# Patient Record
Sex: Female | Born: 1960
Health system: Southern US, Community
[De-identification: ages and names within clinical notes are randomized; demographics above are authoritative.]

## PROBLEM LIST (undated history)

## (undated) DIAGNOSIS — T7840XA Allergy, unspecified, initial encounter: Secondary | ICD-10-CM

## (undated) DIAGNOSIS — R7989 Other specified abnormal findings of blood chemistry: Secondary | ICD-10-CM

## (undated) DIAGNOSIS — F329 Major depressive disorder, single episode, unspecified: Secondary | ICD-10-CM

## (undated) DIAGNOSIS — R519 Headache, unspecified: Secondary | ICD-10-CM

## (undated) DIAGNOSIS — N390 Urinary tract infection, site not specified: Secondary | ICD-10-CM

## (undated) DIAGNOSIS — R740 Nonspecific elevation of levels of transaminase and lactic acid dehydrogenase [LDH]: Secondary | ICD-10-CM

## (undated) DIAGNOSIS — G43909 Migraine, unspecified, not intractable, without status migrainosus: Secondary | ICD-10-CM

## (undated) DIAGNOSIS — R51 Headache: Secondary | ICD-10-CM

## (undated) DIAGNOSIS — E785 Hyperlipidemia, unspecified: Secondary | ICD-10-CM

## (undated) DIAGNOSIS — F419 Anxiety disorder, unspecified: Secondary | ICD-10-CM

## (undated) DIAGNOSIS — M797 Fibromyalgia: Secondary | ICD-10-CM

## (undated) DIAGNOSIS — R32 Unspecified urinary incontinence: Secondary | ICD-10-CM

## (undated) DIAGNOSIS — F32A Depression, unspecified: Secondary | ICD-10-CM

## (undated) HISTORY — DX: Fibromyalgia: M79.7

## (undated) HISTORY — DX: Other specified abnormal findings of blood chemistry: R79.89

## (undated) HISTORY — DX: Nonspecific elevation of levels of transaminase and lactic acid dehydrogenase (ldh): R74.0

## (undated) HISTORY — PX: TONSILLECTOMY: SUR1361

## (undated) HISTORY — DX: Allergy, unspecified, initial encounter: T78.40XA

## (undated) HISTORY — DX: Migraine, unspecified, not intractable, without status migrainosus: G43.909

## (undated) HISTORY — DX: Major depressive disorder, single episode, unspecified: F32.9

## (undated) HISTORY — DX: Headache, unspecified: R51.9

## (undated) HISTORY — DX: Depression, unspecified: F32.A

## (undated) HISTORY — DX: Unspecified urinary incontinence: R32

## (undated) HISTORY — DX: Urinary tract infection, site not specified: N39.0

## (undated) HISTORY — DX: Anxiety disorder, unspecified: F41.9

## (undated) HISTORY — DX: Headache: R51

## (undated) HISTORY — DX: Hyperlipidemia, unspecified: E78.5

## (undated) HISTORY — PX: WISDOM TOOTH EXTRACTION: SHX21

---

## 2004-11-13 HISTORY — PX: LIPOMA EXCISION: SHX5283

## 2014-09-22 ENCOUNTER — Encounter: Payer: Self-pay | Admitting: Family Medicine

## 2014-09-22 ENCOUNTER — Ambulatory Visit (INDEPENDENT_AMBULATORY_CARE_PROVIDER_SITE_OTHER): Payer: Managed Care, Other (non HMO) | Admitting: Family Medicine

## 2014-09-22 VITALS — BP 112/78 | HR 82 | Temp 98.5°F | Ht 63.25 in | Wt 135.5 lb

## 2014-09-22 DIAGNOSIS — F411 Generalized anxiety disorder: Secondary | ICD-10-CM

## 2014-09-22 DIAGNOSIS — R32 Unspecified urinary incontinence: Secondary | ICD-10-CM | POA: Insufficient documentation

## 2014-09-22 DIAGNOSIS — M797 Fibromyalgia: Secondary | ICD-10-CM

## 2014-09-22 DIAGNOSIS — F410 Panic disorder [episodic paroxysmal anxiety] without agoraphobia: Secondary | ICD-10-CM

## 2014-09-22 MED ORDER — LORAZEPAM 0.5 MG PO TABS: 0.5000 mg | ORAL_TABLET | Freq: Two times a day (BID) | ORAL | Status: DC | PRN

## 2014-09-22 NOTE — Progress Notes (Signed)
HPI:  Madison Hart is here to establish care. Recently moved here from [place]. Last PCP and physical:  Has the following chronic problems that require follow up and concerns today:  Stress Incontinence: -dx 5 years ago -some urge and some stress -reports did pelvic floor exercises -tried a medication - she does not tolerate medications well, saw a physical therapist -never saw a urologist  GAD: -for about 5 years and worsening gradually -paleo diet helps -generalized worry all the time, occ panic attack, went through counseling with son and this helped -she wants to take something as needed for panic Restlessness, on edge: sometimes Easily Fatigued:yes Difficulty concentrating: no Irritability: no Muscle tension - yes, but has fibromyalgia Sleep Disturbance: yes - but she thinks this is related to the fibromyalgia  Fibromyalgia: -has seen rheumatologists in the past many times -no medications have worked -she does not want to take a medications -she does see a Restaurant manager, fast food and has changed - paleo diet  ROS negative for unless reported above: fevers, unintentional weight loss, hearing or vision loss, chest pain, palpitations, struggling to breath, hemoptysis, melena, hematochezia, hematuria, falls, loc, si, thoughts of self harm  Past Medical History  Diagnosis Date  . Fibromyalgia   . Allergy   . Depression   . Frequent headaches   . Migraine   . Urine incontinence   . Urinary tract infection     Past Surgical History  Procedure Laterality Date  . Tonsillectomy    . Wisdom tooth extraction      Family History  Problem Relation Age of Onset  . Cancer Father     mesothelioma - worked in Automotive engineer  . Hyperlipidemia Father   . Hypertension Father   . Mental illness Son   . Celiac disease Sister     History   Social History  . Marital Status: Married    Spouse Name: N/A    Number of Children: N/A  . Years of Education: N/A   Social History  Main Topics  . Smoking status: Never Smoker   . Smokeless tobacco: None  . Alcohol Use: No  . Drug Use: No  . Sexual Activity: None   Other Topics Concern  . 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      Home Situation: lives husband and daughter      Spiritual Beliefs: Christian      Lifestyle: regular exercise, healthy paleo diet - gluten free          Current outpatient prescriptions: COD LIVER OIL PO, Take by mouth., Disp: , Rfl: ;  ibuprofen (ADVIL,MOTRIN) 200 MG tablet, Take 200 mg by mouth as needed., Disp: , Rfl: ;  loratadine (CLARITIN) 10 MG tablet, Take 10 mg by mouth daily., Disp: , Rfl: ;  NON FORMULARY, Endura pak guard, Disp: , Rfl: ;  Probiotic Product (PROBIOTIC PO), Take by mouth., Disp: , Rfl:  LORazepam (ATIVAN) 0.5 MG tablet, Take 1 tablet (0.5 mg total) by mouth 2 (two) times daily as needed for anxiety., Disp: 15 tablet, Rfl: 0;  Multiple Vitamins-Minerals (EMERGEN-C IMMUNE PO), Take by mouth., Disp: , Rfl:   EXAM:  Filed Vitals:   09/22/14 1619  BP: 112/78  Pulse: 82  Temp: 98.5 F (36.9 C)    Body mass index is 23.8 kg/(m^2).  GENERAL: vitals reviewed and listed above, alert, oriented, appears well hydrated and in no acute distress  HEENT: atraumatic, conjunttiva clear, no obvious abnormalities on inspection of external nose and ears  NECK: no obvious masses on inspection  LUNGS: clear to auscultation bilaterally, no wheezes, rales or rhonchi, good air movement  CV: HRRR, no peripheral edema  MS: moves all extremities without noticeable abnormality  PSYCH: pleasant and cooperative, no obvious depression or anxiety  ASSESSMENT AND PLAN:  Discussed the following assessment and plan:  Urinary incontinence, unspecified incontinence type -discussed tx options and it seems she has tried most -trial gradually increased scheduled voiding time  -consider urology  referral   GAD (generalized anxiety disorder) Panic disorder - Plan: LORazepam (ATIVAN) 0.5 MG tablet -she is very sensitive to medications -I advised CBT and SSRI - but she does not want to take a medication daily -she has opted for rare use of benzo for panic - I advised if escalating use would advise she see a psychiatrist as I do not routinely prescribe these for regular risks due to risks - discussed risks  Fibromyalgia  -We reviewed the PMH, PSH, FH, SH, Meds and Allergies. -We provided refills for any medications we will prescribe as needed. -We addressed current concerns per orders and patient instructions. -We have asked for records for pertinent exams, studies, vaccines and notes from previous providers. -We have advised patient to follow up per instructions below.   -Patient advised to return or notify a doctor immediately if symptoms worsen or persist or new concerns arise.  Patient Instructions  -As we discussed, we have prescribed a new medication for you at this appointment. We discussed the common and serious potential adverse effects of this medication and you can review these and more with the pharmacist when you pick up your medication.  Please follow the instructions for use carefully and notify us immediately if you have any problems taking this medication.  Try gradually increasing the voiding time by 15 minutes weekly and restarting the pelvic floor exercises.   FOR YOUR ANXIETY, STRESS or DEPRESSION:  []  Seek counseling - this is as effective as medications and will help to get at the root of the imbalance. Please use the number provided to set up an appointment.  []  Ensure AT LEAST 150 minutes of cardiovascular exercise per week - 30 minutes of sweaty exercise daily is best.  []  Set a schedule that includes adequate time for sleep, fun activities and exercise.  []  Medications are best used short term while finding a healthier more balanced life that promotes  good emotional and mental health. I do not prescribe sleep medications such as Ambien, etc. or controlled anxiety medications such as Xanax, Klonopin, etc. long term in adult patients and will have you see a psychiatrist if these types of medications are required on more then a temporary basis. The Ativan is for rare use with the panic attacks, but if needing this on a more frequent basis will advise you to see a psychiatrist.         Lucretia Kern.

## 2014-09-22 NOTE — Progress Notes (Signed)
Pre visit review using our clinic review tool, if applicable. No additional management support is needed unless otherwise documented below in the visit note. 

## 2014-09-22 NOTE — Progress Notes (Deleted)
HPI:  Madison Hart is here to establish care.  Last PCP and physical:  Has the following chronic problems that require follow up and concerns today:     ROS negative for unless reported above: fevers, unintentional weight loss, hearing or vision loss, chest pain, palpitations, struggling to breath, hemoptysis, melena, hematochezia, hematuria, falls, loc, si, thoughts of self harm  Past Medical History  Diagnosis Date  . Fibromyalgia   . Allergy   . Depression   . Frequent headaches   . Migraine   . Urine incontinence   . Urinary tract infection     Past Surgical History  Procedure Laterality Date  . Tonsillectomy    . Wisdom tooth extraction      Family History  Problem Relation Age of Onset  . Cancer Father     mesothelioma - worked in Automotive engineer  . Hyperlipidemia Father   . Hypertension Father   . Mental illness Son   . Celiac disease Sister     History   Social History  . Marital Status: Married    Spouse Name: N/A    Number of Children: N/A  . Years of Education: N/A   Social History Main Topics  . Smoking status: Never Smoker   . Smokeless tobacco: None  . Alcohol Use: No  . Drug Use: No  . Sexual Activity: None   Other Topics Concern  . 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      Home Situation: lives husband and daughter      Spiritual Beliefs: Christian      Lifestyle: regular exercise, healthy paleo diet - gluten free          Current outpatient prescriptions: COD LIVER OIL PO, Take by mouth., Disp: , Rfl: ;  ibuprofen (ADVIL,MOTRIN) 200 MG tablet, Take 200 mg by mouth as needed., Disp: , Rfl: ;  loratadine (CLARITIN) 10 MG tablet, Take 10 mg by mouth daily., Disp: , Rfl: ;  NON FORMULARY, Endura pak guard, Disp: , Rfl: ;  Probiotic Product (PROBIOTIC PO), Take by mouth., Disp: , Rfl:  LORazepam (ATIVAN) 0.5 MG tablet, Take 1 tablet (0.5 mg total) by  mouth 2 (two) times daily as needed for anxiety., Disp: 15 tablet, Rfl: 0;  Multiple Vitamins-Minerals (EMERGEN-C IMMUNE PO), Take by mouth., Disp: , Rfl:   EXAM:  Filed Vitals:   09/22/14 1619  BP: 112/78  Pulse: 82  Temp: 98.5 F (36.9 C)    Body mass index is 23.8 kg/(m^2).  GENERAL: vitals reviewed and listed above, alert, oriented, appears well hydrated and in no acute distress  HEENT: atraumatic, conjunttiva clear, no obvious abnormalities on inspection of external nose and ears  NECK: no obvious masses on inspection  LUNGS: clear to auscultation bilaterally, no wheezes, rales or rhonchi, good air movement  CV: HRRR, no peripheral edema  MS: moves all extremities without noticeable abnormality  PSYCH: pleasant and cooperative, no obvious depression or anxiety  ASSESSMENT AND PLAN:  Discussed the following assessment and plan:  Urinary incontinence, unspecified incontinence type  GAD (generalized anxiety disorder)  Panic disorder - Plan: LORazepam (ATIVAN) 0.5 MG tablet  Fibromyalgia -We reviewed the PMH, PSH, FH, SH, Meds and Allergies. -We provided refills for any medications we will prescribe as needed. -We addressed current concerns per orders and patient instructions. -We have asked for records for pertinent exams, studies, vaccines and notes from previous  providers. -We have advised patient to follow up per instructions below.   -Patient advised to return or notify a doctor immediately if symptoms worsen or persist or new concerns arise.  Patient Instructions  -As we discussed, we have prescribed a new medication for you at this appointment. We discussed the common and serious potential adverse effects of this medication and you can review these and more with the pharmacist when you pick up your medication.  Please follow the instructions for use carefully and notify us immediately if you have any problems taking this medication.  Try gradually increasing  the voiding time by 15 minutes weekly and restarting the pelvic floor exercises.   FOR YOUR ANXIETY, STRESS or DEPRESSION:  []  Seek counseling - this is as effective as medications and will help to get at the root of the imbalance. Please use the number provided to set up an appointment.  []  Ensure AT LEAST 150 minutes of cardiovascular exercise per week - 30 minutes of sweaty exercise daily is best.  []  Set a schedule that includes adequate time for sleep, fun activities and exercise.  []  Medications are best used short term while finding a healthier more balanced life that promotes good emotional and mental health. I do not prescribe sleep medications such as Ambien, etc. or controlled anxiety medications such as Xanax, Klonopin, etc. long term in adult patients and will have you see a psychiatrist if these types of medications are required on more then a temporary basis. The Ativan is for rare use with the panic attacks, but if needing this on a more frequent basis will advise you to see a psychiatrist.         Lucretia Kern.

## 2014-09-22 NOTE — Patient Instructions (Signed)
-  As we discussed, we have prescribed a new medication for you at this appointment. We discussed the common and serious potential adverse effects of this medication and you can review these and more with the pharmacist when you pick up your medication.  Please follow the instructions for use carefully and notify us immediately if you have any problems taking this medication.  Try gradually increasing the voiding time by 15 minutes weekly and restarting the pelvic floor exercises.   FOR YOUR ANXIETY, STRESS or DEPRESSION:  []  Seek counseling - this is as effective as medications and will help to get at the root of the imbalance. Please use the number provided to set up an appointment.  []  Ensure AT LEAST 150 minutes of cardiovascular exercise per week - 30 minutes of sweaty exercise daily is best.  []  Set a schedule that includes adequate time for sleep, fun activities and exercise.  []  Medications are best used short term while finding a healthier more balanced life that promotes good emotional and mental health. I do not prescribe sleep medications such as Ambien, etc. or controlled anxiety medications such as Xanax, Klonopin, etc. long term in adult patients and will have you see a psychiatrist if these types of medications are required on more then a temporary basis. The Ativan is for rare use with the panic attacks, but if needing this on a more frequent basis will advise you to see a psychiatrist.

## 2014-10-13 ENCOUNTER — Ambulatory Visit: Payer: Self-pay | Admitting: Family Medicine

## 2014-12-23 ENCOUNTER — Encounter: Payer: Self-pay | Admitting: Family Medicine

## 2014-12-23 ENCOUNTER — Ambulatory Visit (INDEPENDENT_AMBULATORY_CARE_PROVIDER_SITE_OTHER): Payer: Managed Care, Other (non HMO) | Admitting: Family Medicine

## 2014-12-23 VITALS — BP 126/84 | HR 82 | Temp 97.9°F | Ht 63.75 in | Wt 136.7 lb

## 2014-12-23 DIAGNOSIS — R5383 Other fatigue: Secondary | ICD-10-CM

## 2014-12-23 DIAGNOSIS — M797 Fibromyalgia: Secondary | ICD-10-CM

## 2014-12-23 DIAGNOSIS — E785 Hyperlipidemia, unspecified: Secondary | ICD-10-CM

## 2014-12-23 DIAGNOSIS — F411 Generalized anxiety disorder: Secondary | ICD-10-CM

## 2014-12-23 DIAGNOSIS — F410 Panic disorder [episodic paroxysmal anxiety] without agoraphobia: Secondary | ICD-10-CM

## 2014-12-23 DIAGNOSIS — R32 Unspecified urinary incontinence: Secondary | ICD-10-CM

## 2014-12-23 LAB — LIPID PANEL
Cholesterol: 238 mg/dL — ABNORMAL HIGH (ref 0–200)
HDL: 47.6 mg/dL (ref >39.00)
LDL Cholesterol: 152 mg/dL — ABNORMAL HIGH (ref 0–99)
NONHDL: 190.4
Total CHOL/HDL Ratio: 5
Triglycerides: 190 mg/dL — ABNORMAL HIGH (ref 0.0–149.0)
VLDL: 38 mg/dL (ref 0.0–40.0)

## 2014-12-23 LAB — CBC WITH DIFFERENTIAL/PLATELET
Basophils Absolute: 0 10*3/uL (ref 0.0–0.1)
Basophils Relative: 0.8 % (ref 0.0–3.0)
EOS ABS: 0.1 10*3/uL (ref 0.0–0.7)
Eosinophils Relative: 2.4 % (ref 0.0–5.0)
HCT: 42.9 % (ref 36.0–46.0)
Hemoglobin: 14.6 g/dL (ref 12.0–15.0)
Lymphocytes Relative: 23.1 % (ref 12.0–46.0)
Lymphs Abs: 1.2 10*3/uL (ref 0.7–4.0)
MCHC: 34 g/dL (ref 30.0–36.0)
MCV: 91.2 fl (ref 78.0–100.0)
MONOS PCT: 10.1 % (ref 3.0–12.0)
Monocytes Absolute: 0.5 10*3/uL (ref 0.1–1.0)
NEUTROS ABS: 3.4 10*3/uL (ref 1.4–7.7)
Neutrophils Relative %: 63.6 % (ref 43.0–77.0)
Platelets: 245 10*3/uL (ref 150.0–400.0)
RBC: 4.71 Mil/uL (ref 3.87–5.11)
RDW: 13.5 % (ref 11.5–15.5)
WBC: 5.4 10*3/uL (ref 4.0–10.5)

## 2014-12-23 LAB — BASIC METABOLIC PANEL
BUN: 23 mg/dL (ref 6–23)
CALCIUM: 9.4 mg/dL (ref 8.4–10.5)
CHLORIDE: 105 meq/L (ref 96–112)
CO2: 29 mEq/L (ref 19–32)
Creatinine, Ser: 0.71 mg/dL (ref 0.40–1.20)
GFR: 90.65 mL/min (ref >60.00)
Glucose, Bld: 105 mg/dL — ABNORMAL HIGH (ref 70–99)
Potassium: 4.1 mEq/L (ref 3.5–5.1)
Sodium: 140 mEq/L (ref 135–145)

## 2014-12-23 LAB — VITAMIN D 25 HYDROXY (VIT D DEFICIENCY, FRACTURES): VITD: 26.63 ng/mL — AB (ref 30.00–100.00)

## 2014-12-23 LAB — TSH: TSH: 3.06 u[IU]/mL (ref 0.35–4.50)

## 2014-12-23 NOTE — Patient Instructions (Addendum)
BEFORE YOU LEAVE: -FASTING labs -schedule physical in 3 months  We recommend the following healthy lifestyle measures: - eat a healthy diet consisting of lots of vegetables, fruits, beans, nuts, seeds, healthy meats such as white chicken and fish and whole grains.  - avoid fried foods, fast food, processed foods, sodas, red meet and other fattening foods.  - get a least 150 minutes of aerobic exercise per week.   Consider cognitive behavioral therapy

## 2014-12-23 NOTE — Progress Notes (Signed)
Pre visit review using our clinic review tool, if applicable. No additional management support is needed unless otherwise documented below in the visit note. 

## 2014-12-23 NOTE — Progress Notes (Signed)
HPI:  Stress Incontinence: -dx 5 years ago -some urge and some stress -reports did pelvic floor exercises -tried medication - she does not tolerate medications well, saw a physical therapist -never saw a urologist -advised gradually increased timed voiding last visit  GAD: -for about 5 years and worsening gradually -paleo diet helps -generalized worry all the time, occ panic attack, went through counseling with son and this helped Restlessness, on edge: sometimes Easily Fatigued:yes Difficulty concentrating: no Irritability: no Muscle tension - yes, but has fibromyalgia Sleep Disturbance: yes - but she thinks this is related to the fibromyalgia -refused CBT or daily medication, uses ativan for panic attacks  Fibromyalgia: -has seen rheumatologists in the past many times -no medications have worked -she does not want to take a medications -she does see a Restaurant manager, fast food and has changed - paleo diet -sensitive to cold and some fatigue for her whole life -mild hyperlipidemia in the past -she wants to check TSH and vit D - reports can't take oral vit D -migraines forever  ROS: See pertinent positives and negatives per HPI.  Past Medical History  Diagnosis Date  . Fibromyalgia   . Allergy   . Depression   . Frequent headaches   . Migraine   . Urine incontinence   . Urinary tract infection     Past Surgical History  Procedure Laterality Date  . Tonsillectomy    . Wisdom tooth extraction      Family History  Problem Relation Age of Onset  . Cancer Father     mesothelioma - worked in Automotive engineer  . Hyperlipidemia Father   . Hypertension Father   . Mental illness Son   . Celiac disease Sister     History   Social History  . Marital Status: Married    Spouse Name: N/A  . Number of Children: N/A  . Years of Education: N/A   Social History Main Topics  . Smoking status: Never Smoker   . Smokeless tobacco: Not on file  . Alcohol Use: No  . Drug Use: No  .  Sexual Activity: Not on file   Other Topics Concern  . 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  COD LIVER OIL PO, Take by mouth., Disp: , Rfl:  .  ibuprofen (ADVIL,MOTRIN) 200 MG tablet, Take 200 mg by mouth as needed., Disp: , Rfl:  .  LORazepam (ATIVAN) 0.5 MG tablet, Take 1 tablet (0.5 mg total) by mouth 2 (two) times daily as needed for anxiety., Disp: 15 tablet, Rfl: 0 .  Multiple Vitamins-Minerals (EMERGEN-C IMMUNE PO), Take by mouth., Disp: , Rfl:  .  NON FORMULARY, Endura pak guard, Disp: , Rfl:  .  Probiotic Product (PROBIOTIC PO), Take by mouth., Disp: , Rfl:   EXAM:  Filed Vitals:   12/23/14 0851  BP: 126/84  Pulse: 82  Temp: 97.9 F (36.6 C)    Body mass index is 23.66 kg/(m^2).  GENERAL: vitals reviewed and listed above, alert, oriented, appears well hydrated and in no acute distress  HEENT: atraumatic, conjunttiva clear, no obvious abnormalities on inspection of external nose and ears  NECK: no obvious  masses on inspection  LUNGS: clear to auscultation bilaterally, no wheezes, rales or rhonchi, good air movement  CV: HRRR, no peripheral edema  MS: moves all extremities without noticeable abnormality  PSYCH: pleasant and cooperative, no obvious depression or anxiety  ASSESSMENT AND PLAN:  Discussed the following assessment and plan:  GAD (generalized anxiety disorder)  Panic disorder  Fibromyalgia - Plan: TSH  Urinary incontinence, unspecified incontinence type  Other fatigue - Plan: TSH, Lipid Panel, CBC with Differential, Basic metabolic panel, Vitamin D, 25-hydroxy  Hyperlipemia - Plan: Lipid Panel  -Patient advised to return or notify a doctor immediately if  symptoms worsen or persist or new concerns arise.  Patient Instructions  BEFORE YOU LEAVE: -FASTING labs -schedule physical in 3 months  We recommend the following healthy lifestyle measures: - eat a healthy diet consisting of lots of vegetables, fruits, beans, nuts, seeds, healthy meats such as white chicken and fish and whole grains.  - avoid fried foods, fast food, processed foods, sodas, red meet and other fattening foods.  - get a least 150 minutes of aerobic exercise per week.        Colin Benton R.

## 2014-12-25 ENCOUNTER — Encounter: Payer: Self-pay | Admitting: *Deleted

## 2015-01-13 ENCOUNTER — Other Ambulatory Visit (INDEPENDENT_AMBULATORY_CARE_PROVIDER_SITE_OTHER): Payer: Managed Care, Other (non HMO)

## 2015-01-13 DIAGNOSIS — R195 Other fecal abnormalities: Secondary | ICD-10-CM

## 2015-01-13 LAB — POC HEMOCCULT BLD/STL (HOME/3-CARD/SCREEN)
Card #3 Fecal Occult Blood, POC: NEGATIVE
FECAL OCCULT BLD: NEGATIVE
FECAL OCCULT BLD: NEGATIVE

## 2015-02-10 ENCOUNTER — Telehealth: Payer: Self-pay | Admitting: Family Medicine

## 2015-02-10 NOTE — Telephone Encounter (Signed)
Pt would like result of stool cultures cards she mailed in feb 2016

## 2015-02-11 NOTE — Telephone Encounter (Signed)
Left message on machine for patient to return our call 

## 2015-02-11 NOTE — Telephone Encounter (Signed)
Please provide her results. Thanks.

## 2015-02-12 NOTE — Telephone Encounter (Signed)
Patient informed stool cards were negative.

## 2015-03-23 ENCOUNTER — Encounter: Payer: Self-pay | Admitting: Family Medicine

## 2015-03-23 ENCOUNTER — Ambulatory Visit (INDEPENDENT_AMBULATORY_CARE_PROVIDER_SITE_OTHER): Payer: Managed Care, Other (non HMO) | Admitting: Family Medicine

## 2015-03-23 VITALS — BP 110/82 | HR 71 | Temp 98.1°F | Ht 63.5 in | Wt 130.6 lb

## 2015-03-23 DIAGNOSIS — Z0000 Encounter for general adult medical examination without abnormal findings: Secondary | ICD-10-CM

## 2015-03-23 DIAGNOSIS — Z780 Asymptomatic menopausal state: Secondary | ICD-10-CM | POA: Diagnosis not present

## 2015-03-23 DIAGNOSIS — R739 Hyperglycemia, unspecified: Secondary | ICD-10-CM | POA: Diagnosis not present

## 2015-03-23 DIAGNOSIS — E785 Hyperlipidemia, unspecified: Secondary | ICD-10-CM

## 2015-03-23 DIAGNOSIS — N950 Postmenopausal bleeding: Secondary | ICD-10-CM

## 2015-03-23 NOTE — Progress Notes (Signed)
HPI:  Here for CPE:  -Concerns and/or follow up today:  Stress Incontinence: -dx 5 years ago -some urge and some stress -reports did pelvic floor exercises; time voiding last visit -tried medication - she does not tolerate medications well, saw a physical therapist -never saw a urologist  GAD: -for about 5 years and worsening gradually -paleo diet and counseling with son helps -generalized worry all the time, occ panic attack -refused CBT now or daily medication, uses ativan for panic attacks  Fibromyalgia: -has seen rheumatologists in the past many times -no medications have worked -she does not want to take a medications -she does see a Restaurant manager, fast food and does paleo diet -sensitive to cold and some fatigue for her whole life -mild hyperlipidemia in the past -migraines forever  HLD/mild hyperglycemia: -Diet: paleo - but has changed diet a little bit -Exercise:  regular exercise - she had been lax on this but has restarted  -Taking folic acid, vitamin D or calcium: no - reports can't take oral vit D - reports flare of fibromyalgia with Vit D  -Diabetes and Dyslipidemia Screening: done 12/2014 - advised healthy diet and regular exercise  -Hx of HTN: no  -Vaccines: tdap  -pap history: thinks was 3 yrs ago, normal in the past  -FDLMP: n/a postmenopausal - but reports did have one episode of postmenopausal bleeding 1 month with cramping and bloating had not had period prior to this for a very long time, was similar to the last days of period per her report, light, brown, no vaginal discharge or symptoms otherwise  -sexual activity: yes, female partner, no new partners  -wants STI testing: no  -FH breast, colon or ovarian ca: see FH Last mammogram: not done Last colon cancer screening: did stool cards this year, refused colonoscopy  Breast Ca Risk Assessment: -no family hx breast or ovarian ca  -Alcohol, Tobacco, drug use: see social history  Review of Systems - no  fevers, unintentional weight loss, vision loss, hearing loss, chest pain, sob, hemoptysis, melena, hematochezia, hematuria, genital discharge, changing or concerning skin lesions, bleeding, bruising, loc, thoughts of self harm or SI  Past Medical History  Diagnosis Date  . Fibromyalgia   . Allergy   . Depression   . Frequent headaches   . Migraine   . Urine incontinence   . Urinary tract infection     Past Surgical History  Procedure Laterality Date  . Tonsillectomy    . Wisdom tooth extraction      Family History  Problem Relation Age of Onset  . Cancer Father     mesothelioma - worked in Automotive engineer  . Hyperlipidemia Father   . Hypertension Father   . Mental illness Son   . Celiac disease Sister     History   Social History  . Marital Status: Married    Spouse Name: N/A  . Number of Children: N/A  . Years of Education: N/A   Social History Main Topics  . Smoking status: Never Smoker   . Smokeless tobacco: Not on file  . Alcohol Use: No  . Drug Use: No  . Sexual Activity: Not on file   Other Topics Concern  . 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  COD LIVER OIL PO, Take by mouth., Disp: , Rfl:  .  ibuprofen (ADVIL,MOTRIN) 200 MG tablet, Take 200 mg by mouth as needed., Disp: , Rfl:  .  LORazepam (ATIVAN) 0.5 MG tablet, Take 1 tablet (0.5 mg total) by mouth 2 (two) times daily as needed for anxiety., Disp: 15 tablet, Rfl: 0 .  Multiple Vitamins-Minerals (EMERGEN-C IMMUNE PO), Take by mouth., Disp: , Rfl:  .  NON FORMULARY, Endura pak guard, Disp: , Rfl:  .  Probiotic Product (PROBIOTIC PO), Take by mouth., Disp: , Rfl:   EXAM:  Filed Vitals:   03/23/15 0828  BP: 110/82   Pulse: 71  Temp: 98.1 F (36.7 C)    GENERAL: vitals reviewed and listed below, alert, oriented, appears well hydrated and in no acute distress  HEENT: head atraumatic, PERRLA, normal appearance of eyes, ears, nose and mouth. moist mucus membranes.  NECK: supple, no masses or lymphadenopathy  LUNGS: clear to auscultation bilaterally, no rales, rhonchi or wheeze  CV: HRRR, no peripheral edema or cyanosis, normal pedal pulses  BREAST: declined  ABDOMEN: bowel sounds normal, soft, non tender to palpation, no masses, no rebound or guarding  GU: declined  RECTAL: refused  SKIN: no rash or abnormal lesions  MS: normal gait, moves all extremities normally  NEURO: CN II-XII grossly intact, normal muscle strength and sensation to light touch on extremities  PSYCH: normal affect, pleasant and cooperative  ASSESSMENT AND PLAN:  Discussed the following assessment and plan:  1. Visit for preventive health examination Visit for preventive health examination -Discussed and advised all [place] preventive services health task force level A and B recommendations for age, sex and risks. -Advised at least 150 minutes of exercise per week and a healthy diet low in saturated fats and sweets and consisting of fresh fruits and vegetables, lean meats such as fish and white chicken and whole grains. - she will schedule lab appointment as wants to work on diet and exercise a little longer before lab recheck, studies and vaccines per orders this encounter -she opted to do pap and breast exam with gyn  2. Postmenopausal bleeding - Ambulatory referral to Gynecology -likely benign given only occurred once, but she opted for gyn eval after discussion of potential etiologist  3. Hyperglycemia - Hemoglobin A1c  4. Hyperlipemia - Lipid Panel     Orders Placed This Encounter  Procedures  . Hemoglobin A1c  . Lipid Panel  . Ambulatory referral to Gynecology    Referral Priority:  Routine     Referral Type:  Consultation    Referral Reason:  Specialty Services Required    Requested Specialty:  Gynecology    Number of Visits Requested:  1    Patient advised to return to clinic immediately if symptoms worsen or persist or new concerns.  There are no Patient Instructions on file for this visit.  No Follow-up on file.  Colin Benton R.

## 2015-03-23 NOTE — Progress Notes (Signed)
Pre visit review using our clinic review tool, if applicable. No additional management support is needed unless otherwise documented below in the visit note. 

## 2015-03-23 NOTE — Patient Instructions (Signed)
BEFORE YOU LEAVE: -schedule lab only appointment for fasting labs in 1-2 months  -We placed a referral for you as discussed to the gynecologist for the spotting, a pap smear and your breast exam. It usually takes about 1-2 weeks to process and schedule this referral. If you have not heard from [place] regarding this appointment in 2 weeks please contact our office.  -schedule your mammogram  -follow up in 3-4 months

## 2015-03-26 ENCOUNTER — Other Ambulatory Visit: Payer: Self-pay

## 2015-03-26 DIAGNOSIS — Z1231 Encounter for screening mammogram for malignant neoplasm of breast: Secondary | ICD-10-CM

## 2015-03-29 ENCOUNTER — Encounter: Payer: Self-pay | Admitting: Obstetrics and Gynecology

## 2015-03-29 ENCOUNTER — Ambulatory Visit (INDEPENDENT_AMBULATORY_CARE_PROVIDER_SITE_OTHER): Payer: Managed Care, Other (non HMO) | Admitting: Obstetrics and Gynecology

## 2015-03-29 VITALS — BP 118/78 | HR 64 | Resp 14 | Ht 64.0 in | Wt 128.2 lb

## 2015-03-29 DIAGNOSIS — N950 Postmenopausal bleeding: Secondary | ICD-10-CM | POA: Diagnosis not present

## 2015-03-29 DIAGNOSIS — Z01419 Encounter for gynecological examination (general) (routine) without abnormal findings: Secondary | ICD-10-CM | POA: Diagnosis not present

## 2015-03-29 NOTE — Patient Instructions (Signed)
EXERCISE AND DIET:  We recommended that you start or continue a regular exercise program for good health. Regular exercise means any activity that makes your heart beat faster and makes you sweat.  We recommend exercising at least 30 minutes per day at least 3 days a week, preferably 4 or 5.  We also recommend a diet low in fat and sugar.  Inactivity, poor dietary choices and obesity can cause diabetes, heart attack, stroke, and kidney damage, among others.    ALCOHOL AND SMOKING:  Women should limit their alcohol intake to no more than 7 drinks/beers/glasses of wine (combined, not each!) per week. Moderation of alcohol intake to this level decreases your risk of breast cancer and liver damage. And of course, no recreational drugs are part of a healthy lifestyle.  And absolutely no smoking or even second hand smoke. Most people know smoking can cause heart and lung diseases, but did you know it also contributes to weakening of your bones? Aging of your skin?  Yellowing of your teeth and nails?  CALCIUM AND VITAMIN D:  Adequate intake of calcium and Vitamin D are recommended.  The recommendations for exact amounts of these supplements seem to change often, but generally speaking 600 mg of calcium (either carbonate or citrate) and 800 units of Vitamin D per day seems prudent. Certain women may benefit from higher intake of Vitamin D.  If you are among these women, your doctor will have told you during your visit.    PAP SMEARS:  Pap smears, to check for cervical cancer or precancers,  have traditionally been done yearly, although recent scientific advances have shown that most women can have pap smears less often.  However, every woman still should have a physical exam from her gynecologist every year. It will include a breast check, inspection of the vulva and vagina to check for abnormal growths or skin changes, a visual exam of the cervix, and then an exam to evaluate the size and shape of the uterus and  ovaries.  And after 56 years of age, a rectal exam is indicated to check for rectal cancers. We will also provide age appropriate advice regarding health maintenance, like when you should have certain vaccines, screening for sexually transmitted diseases, bone density testing, colonoscopy, mammograms, etc.   MAMMOGRAMS:  All women over 40 years old should have a yearly mammogram. Many facilities now offer a "3D" mammogram, which may cost around $50 extra out of pocket. If possible,  we recommend you accept the option to have the 3D mammogram performed.  It both reduces the number of women who will be called back for extra views which then turn out to be normal, and it is better than the routine mammogram at detecting truly abnormal areas.    COLONOSCOPY:  Colonoscopy to screen for colon cancer is recommended for all women at age 50.  We know, you hate the idea of the prep.  We agree, BUT, having colon cancer and not knowing it is worse!!  Colon cancer so often starts as a polyp that can be seen and removed at colonscopy, which can quite literally save your life!  And if your first colonoscopy is normal and you have no family history of colon cancer, most women don't have to have it again for 10 years.  Once every ten years, you can do something that may end up saving your life, right?  We will be happy to help you get it scheduled when you are ready.    Be sure to check your insurance coverage so you understand how much it will cost.  It may be covered as a preventative service at no cost, but you should check your particular policy.     Endometrial Biopsy Endometrial biopsy is a procedure in which a tissue sample is taken from inside the uterus. The tissue sample is then looked at under a microscope to see if the tissue is normal or abnormal. The endometrium is the lining of the uterus. This procedure helps determine where you are in your menstrual cycle and how hormone levels are affecting the lining of the  uterus. This procedure may also be used to evaluate uterine bleeding or to diagnose endometrial cancer, tuberculosis, polyps, or inflammatory conditions.  LET [facility] CARE PROVIDER KNOW ABOUT:  Any allergies you have.  All medicines you are taking, including vitamins, herbs, eye drops, creams, and over-the-counter medicines.  Previous problems you or members of your family have had with the use of anesthetics.  Any blood disorders you have.  Previous surgeries you have had.  Medical conditions you have.  Possibility of pregnancy. RISKS AND COMPLICATIONS Generally, this is a safe procedure. However, as with any procedure, complications can occur. Possible complications include:  Bleeding.  Pelvic infection.  Puncture of the uterine wall with the biopsy device (rare). BEFORE THE PROCEDURE   Keep a record of your menstrual cycles as directed by your health care provider. You may need to schedule your procedure for a specific time in your cycle.  You may want to bring a sanitary pad to wear home after the procedure.  Arrange for someone to drive you home after the procedure if you will be given a medicine to help you relax (sedative). PROCEDURE   You may be given a sedative to relax you.  You will lie on an exam table with your feet and legs supported as in a pelvic exam.  Your health care provider will insert an instrument (speculum) into your vagina to see your cervix.  Your cervix will be cleansed with an antiseptic solution. A medicine (local anesthetic) will be used to numb the cervix.  A forceps instrument (tenaculum) will be used to hold your cervix steady for the biopsy.  A thin, rodlike instrument (uterine sound) will be inserted through your cervix to determine the length of your uterus and the location where the biopsy sample will be removed.  A thin, flexible tube (catheter) will be inserted through your cervix and into the uterus. The catheter is used to  collect the biopsy sample from your endometrial tissue.  The catheter and speculum will then be removed, and the tissue sample will be sent to a lab for examination. AFTER THE PROCEDURE  You will rest in a recovery area until you are ready to go home.  You may have mild cramping and a small amount of vaginal bleeding for a few days after the procedure. This is normal.  Make sure you find out how to get your test results. Document Released: 03/02/2005 Document Revised: 07/02/2013 Document Reviewed: 04/16/2013 [employer] Patient Information 2015 [place], [place]. This information is not intended to replace advice given to you by your health care provider. Make sure you discuss any questions you have with your health care provider.

## 2015-03-29 NOTE — Progress Notes (Signed)
Patient ID: Madison Hart, female   DOB: 20-Feb-1961, 54 y.o.   MRN: [id number] GYNECOLOGY VISIT  PCP:  Colin Benton, DO  Referring provider:   HPI: 54 y.o.   Married  Caucasian  female   [phone] with Patient's last menstrual period was 11/13/2012 (approximate).   here for postmenopausal bleeding on one occasional for one day about six weeks ago.  Brown spotting.   In January felt like she was going to have her menses. Then had bleeding episode 5 - 6 weeks later.   No HRT.   Had an ultrasound with her second child showing fibroids.   History of heavy menses in past.   History of stress incontinence - leak with cough, sneeze, and laugh.  FH of stress incontinence.  Has a DVD to help with physical therapy.  From [place] near [place].  One child just graduated from high school.  GYNECOLOGIC HISTORY: Patient's last menstrual period was 11/13/2012 (approximate). Sexually active:  yes Partner preference: female Contraception: postmenopausal   Menopausal hormone therapy:none DES exposure: no Blood transfusions:  no  Sexually transmitted diseases: none   GYN procedures and prior surgeries: no  Last mammogram:  10 years ago--normal.  [facility]           Last pap and high risk HPV testing:   3 years ago--wnl History of abnormal pap smear:  no   OB History    Gravida Para Term Preterm AB TAB SAB Ectopic Multiple Living   3 3 3       3        Past Medical History  Diagnosis Date  . Fibromyalgia   . Allergy   . Depression   . Frequent headaches   . Migraine   . Urine incontinence   . Urinary tract infection   . Anxiety     Past Surgical History  Procedure Laterality Date  . Tonsillectomy    . Wisdom tooth extraction    . Lipoma excision  2006    left leg    Current Outpatient Prescriptions  Medication Sig Dispense Refill  . COD LIVER OIL PO Take by mouth.    Marland Kitchen ibuprofen (ADVIL,MOTRIN) 200 MG tablet Take 200 mg by mouth as needed.     Marland Kitchen LORazepam (ATIVAN) 0.5 MG tablet Take 1 tablet (0.5 mg total) by mouth 2 (two) times daily as needed for anxiety. 15 tablet 0  . Multiple Vitamins-Minerals (EMERGEN-C IMMUNE PO) Take by mouth.    . Probiotic Product (PROBIOTIC PO) Take by mouth.     No current facility-administered medications for this visit.     ALLERGIES: Claritin; Detrol; Erythromycin; and Penicillins  Family History  Problem Relation Age of Onset  . Cancer Father     mesothelioma - worked in Automotive engineer  . Hyperlipidemia Father   . Hypertension Father   . Mental illness Son   . Celiac disease Sister   . Migraines Sister   . Hyperlipidemia Mother     History   Social History  . Marital Status: Married    Spouse Name: N/A  . Number of Children: N/A  . Years of Education: N/A   Occupational History  . Not on file.   Social History Main Topics  . Smoking status: Never Smoker   . Smokeless tobacco: Not on file  . Alcohol Use: No  . Drug Use: No  . Sexual Activity:    Partners: Male    Birth Control/ Protection: Post-menopausal  Other Topics Concern  . 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  PHYSICAL EXAMINATION:    BP 118/78 mmHg  Pulse 64  Resp 14  Ht 5\' 4"  (1.626 m)  Wt 128 lb 3.2 oz (58.151 kg)  BMI 21.99 kg/m2  LMP 11/13/2012 (Approximate)   Wt Readings from Last 3 Encounters:  03/29/15 128 lb 3.2 oz (58.151 kg)  03/23/15 130 lb 9.6 oz (59.24 kg)  12/23/14 136 lb 11.2 oz (62.007 kg)     Ht Readings from Last 3 Encounters:  03/29/15 5\' 4"  (1.626 m)  03/23/15 5' 3.5" (1.613 m)  12/23/14 5' 3.75" (1.619 m)    General appearance: alert, cooperative and appears stated age Head:  Normocephalic, without obvious abnormality, atraumatic Neck: no adenopathy, supple, symmetrical, trachea midline and thyroid not enlarged, symmetric, no tenderness/mass/nodules Lungs: clear to auscultation bilaterally Breasts: Inspection negative, No nipple retraction or dimpling, No nipple discharge or bleeding, No axillary or supraclavicular adenopathy, Normal to palpation without dominant masses Heart: regular rate and rhythm Abdomen: soft, non-tender; no masses,  no organomegaly Extremities: extremities normal, atraumatic, no cyanosis or edema Skin: Skin color, texture, turgor normal. No rashes or lesions Lymph nodes: Cervical, supraclavicular, and axillary nodes normal. No abnormal inguinal nodes palpated Neurologic: Grossly normal  Pelvic: External genitalia:  no lesions              Urethra:  normal appearing urethra with no masses, tenderness or lesions              Bartholins and Skenes: normal                 Vagina: normal appearing vagina with normal color and discharge, no lesions              Cervix: normal appearance.  Pap smear taken - yes.                  Bimanual Exam:  Uterus:  uterus is normal size, shape, consistency and nontender                                      Adnexa: normal adnexa in size, nontender and no masses                                      Rectovaginal: Confirms                                      Anus:  normal sphincter tone, no lesions  ASSESSMENT  Encounter for gynecologic visit.  Postmenopausal bleeding episode.  Urinary incontinence.   PLAN  Pap and HR HPV testing done.  Recommendation for mammogram.  Will do at [facility] June.  Discussed etiologies of and work up of postmenopausal bleeding. Will return for pelvic ultrasound, possible sonohysterogram, and possible endometrial biopsy.  Procedures explained.  These will be precerted.  Discussed urinary incontinence.  Declines further care at this time.  Routine labs with PCP.   Vaccines with PCP.  Colonoscopy discussed.  Patient declines and will do IFOBs with PCP instead.   An After Visit Summary was printed and given to the patient.  __10____ minutes additional face to face time regarding postmenopausal bleeding - of which over 50% was spent in counseling.

## 2015-03-31 LAB — IPS PAP TEST WITH HPV

## 2015-04-02 ENCOUNTER — Telehealth: Payer: Self-pay | Admitting: *Deleted

## 2015-04-02 NOTE — Telephone Encounter (Signed)
Pt has a mammogram scheduled in June.

## 2015-04-02 NOTE — Telephone Encounter (Signed)
Left message for pt to call back.  Need to know if pt had mammogram, when and where

## 2015-04-16 ENCOUNTER — Telehealth: Payer: Self-pay | Admitting: Obstetrics and Gynecology

## 2015-04-16 DIAGNOSIS — N950 Postmenopausal bleeding: Secondary | ICD-10-CM

## 2015-04-16 NOTE — Telephone Encounter (Signed)
Call to patient. Left message with patient spouse for her to call back. Need to go over benefit for SHGM/EMB

## 2015-04-16 NOTE — Telephone Encounter (Signed)
Patient returned call. I advised of benefit quote received for SHGM/EMB.  Patient agreeable. Scheduled procedure. Advised patient of 72 hour cancellation policy and $322 cancellation fee. Patient agreeable.

## 2015-04-16 NOTE — Telephone Encounter (Signed)
Pt called concerning her 5/16 appointment in which additional testing was recommended. She stated she was expecting a call with insurance information of charges and scheduling. Pt is agreeable to a call back.

## 2015-04-16 NOTE — Telephone Encounter (Signed)
Patient was seen on 5/16 with Dr.Silva for aex. Patient recommended to have workup for PMB with PUS/possibleSHGM and possible EMB. Orders placed for precert.

## 2015-04-21 ENCOUNTER — Ambulatory Visit
Admission: RE | Admit: 2015-04-21 | Discharge: 2015-04-21 | Disposition: A | Discharge status: Home / Self Care | Payer: Managed Care, Other (non HMO) | Source: Ambulatory Visit

## 2015-04-21 DIAGNOSIS — Z1231 Encounter for screening mammogram for malignant neoplasm of breast: Secondary | ICD-10-CM

## 2015-05-06 ENCOUNTER — Encounter: Payer: Self-pay | Admitting: Obstetrics and Gynecology

## 2015-05-06 ENCOUNTER — Ambulatory Visit (INDEPENDENT_AMBULATORY_CARE_PROVIDER_SITE_OTHER): Payer: Managed Care, Other (non HMO)

## 2015-05-06 ENCOUNTER — Ambulatory Visit (INDEPENDENT_AMBULATORY_CARE_PROVIDER_SITE_OTHER): Payer: Managed Care, Other (non HMO) | Admitting: Obstetrics and Gynecology

## 2015-05-06 ENCOUNTER — Other Ambulatory Visit: Payer: Self-pay | Admitting: Obstetrics and Gynecology

## 2015-05-06 VITALS — BP 100/78 | HR 70 | Ht 64.0 in | Wt 127.0 lb

## 2015-05-06 DIAGNOSIS — N950 Postmenopausal bleeding: Secondary | ICD-10-CM | POA: Diagnosis not present

## 2015-05-06 DIAGNOSIS — N951 Menopausal and female climacteric states: Secondary | ICD-10-CM | POA: Diagnosis not present

## 2015-05-06 NOTE — Progress Notes (Signed)
Subjective  54 y.o. G37P3003  Caucasian female here for pelvic ultrasound for postmenopausal bleeding episode.  Had bleeding one time for one day.  LMP was 2014.  Not on HRT.  Felt she had a hormonal spike and then had the bleeding following this.  Felt so much better at that time and really feels it was due to an increase in her hormones.  Developed more depressive type symptoms and anxiety in her 37s and have continued.  Can function well.  Not suicidal.  Declines antidepressant therapy.  Will see her naturopath soon to discuss HRT.  Declines Tdap today.  Traveling for a wedding and does not want a sore arm.   Objective  Pelvic ultrasound images and report reviewed with patient.  Uterus - no masses. EMS - 2.81 mm. Ovaries - normal Free fluid - no     Assessment  Postmenopausal bleeding. Reassuring endometrium and pelvic ultrasound in general. Menopausal symptoms.  Depression/anxiety symptoms.   Plan  Discussion of postmenopausal bleeding.  I do not believe that a sonohysterogram or endometrial biopsy is needed.  Call for any future postmenopausal bleeding.  Understands the importance of this in detecting precancer and cancer of the uterus.  Discussion of HRT risks and benefits.  Reviewed Women's Health Initiative.  Discussion of SSRIs/SNRIs.  Declines.  Follow up for annual exam and prn.  Declines TDap.  ___25____ minutes face to face time of which over 50% was spent in counseling.   After visit summary to patient.

## 2015-07-26 ENCOUNTER — Ambulatory Visit: Payer: Managed Care, Other (non HMO) | Admitting: Family Medicine

## 2015-08-10 ENCOUNTER — Encounter: Payer: Self-pay | Admitting: Family Medicine

## 2015-08-10 ENCOUNTER — Ambulatory Visit (INDEPENDENT_AMBULATORY_CARE_PROVIDER_SITE_OTHER): Payer: Managed Care, Other (non HMO) | Admitting: Family Medicine

## 2015-08-10 VITALS — BP 110/78 | HR 89 | Temp 98.4°F | Ht 64.0 in | Wt 127.3 lb

## 2015-08-10 DIAGNOSIS — M79641 Pain in right hand: Secondary | ICD-10-CM

## 2015-08-10 DIAGNOSIS — R739 Hyperglycemia, unspecified: Secondary | ICD-10-CM | POA: Diagnosis not present

## 2015-08-10 DIAGNOSIS — E785 Hyperlipidemia, unspecified: Secondary | ICD-10-CM | POA: Diagnosis not present

## 2015-08-10 NOTE — Progress Notes (Signed)
Pre visit review using our clinic review tool, if applicable. No additional management support is needed unless otherwise documented below in the visit note. 

## 2015-08-10 NOTE — Progress Notes (Signed)
HPI:   HLD/mild hyperglycemia: -advised recheck and medication if not improving - not done as advised -she reports she exercised, then did not for 3 months, then did start again 1 month ago, eating healthy diet -she doe snot want to check labs again until exercising for three months and refused today  R hand pain: -for several months and lump R palmar suface near 3rd mcp joint -no clickin or catching, occ discomfort   ROS: See pertinent positives and negatives per HPI.  Past Medical History  Diagnosis Date  . Fibromyalgia   . Allergy   . Depression   . Frequent headaches   . Migraine   . Urine incontinence   . Urinary tract infection   . Anxiety     Past Surgical History  Procedure Laterality Date  . Tonsillectomy    . Wisdom tooth extraction    . Lipoma excision  2006    left leg    Family History  Problem Relation Age of Onset  . Cancer Father     mesothelioma - worked in Automotive engineer  . Hyperlipidemia Father   . Hypertension Father   . Mental illness Son   . Celiac disease Sister   . Migraines Sister   . Hyperlipidemia Mother     Social History   Social History  . Marital Status: Married    Spouse Name: N/A  . Number of Children: N/A  . Years of Education: N/A   Social History Main Topics  . Smoking status: Never Smoker   . Smokeless tobacco: None  . Alcohol Use: No  . Drug Use: No  . Sexual Activity:    Partners: Male    Birth Control/ Protection: Post-menopausal   Other Topics Concern  . 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  COD LIVER OIL PO, Take by mouth., Disp: , Rfl:  .  ibuprofen (ADVIL,MOTRIN) 200 MG tablet, Take 200 mg by mouth as needed., Disp: ,  Rfl:  .  LORazepam (ATIVAN) 0.5 MG tablet, Take 1 tablet (0.5 mg total) by mouth 2 (two) times daily as needed for anxiety., Disp: 15 tablet, Rfl: 0 .  Multiple Vitamins-Minerals (EMERGEN-C IMMUNE PO), Take by mouth., Disp: , Rfl:  .  Probiotic Product (PROBIOTIC PO), Take by mouth., Disp: , Rfl:   EXAM:  Filed Vitals:   08/10/15 1339  BP: 110/78  Pulse: 89  Temp: 98.4 F (36.9 C)    Body mass index is 21.84 kg/(m^2).  GENERAL: vitals reviewed and listed above, alert, oriented, appears well hydrated and in no acute distress  HEENT: atraumatic, conjunttiva clear, no obvious abnormalities on inspection of external nose and ears  NECK: no obvious masses on inspection  LUNGS: clear to auscultation bilaterally, no wheezes, rales or rhonchi, good air movement  CV: HRRR, no peripheral edema  MS: moves all extremities without noticeable abnormality, small mildly tender rubbery mobile subcutaneous nodule palmar surface of hand over flexor tendons of 3rd digit  PSYCH: pleasant and cooperative, no obvious depression or anxiety  ASSESSMENT AND PLAN:  Discussed the following assessment and plan:  Hyperlipemia  Hyperglycemia  Hand pain, right  -  advised labs, declined, she agreed to check in 2 months after 3 months of lifestyle changes -discussed potential etiologies of hand nodule with tenosynovitis most likely, offered further eval with US/xray or with sports med for confirmation of diagnosis - declined at this time -Patient advised to return or notify a doctor immediately if symptoms worsen or persist or new concerns arise.  Patient Instructions  BEFORE YOU LEAVE: -schedule fasting lab visit in 2 months  Continue a healthy diet and regular exercise  Let us know if you wish to have further evaluation of the hand     Lexani Corona R.

## 2015-08-10 NOTE — Patient Instructions (Signed)
BEFORE YOU LEAVE: -schedule fasting lab visit in 2 months  Continue a healthy diet and regular exercise  Let us know if you wish to have further evaluation of the hand

## 2015-10-14 ENCOUNTER — Other Ambulatory Visit (INDEPENDENT_AMBULATORY_CARE_PROVIDER_SITE_OTHER): Payer: Managed Care, Other (non HMO)

## 2015-10-14 DIAGNOSIS — E785 Hyperlipidemia, unspecified: Secondary | ICD-10-CM

## 2015-10-14 DIAGNOSIS — E119 Type 2 diabetes mellitus without complications: Secondary | ICD-10-CM

## 2015-10-14 LAB — LIPID PANEL
Cholesterol: 241 mg/dL — ABNORMAL HIGH (ref 0–200)
HDL: 48.5 mg/dL (ref >39.00)
LDL CALC: 175 mg/dL — AB (ref 0–99)
NONHDL: 192.53
Total CHOL/HDL Ratio: 5
Triglycerides: 90 mg/dL (ref 0.0–149.0)
VLDL: 18 mg/dL (ref 0.0–40.0)

## 2015-10-14 LAB — HEMOGLOBIN A1C: HEMOGLOBIN A1C: 5.4 % (ref 4.6–6.5)

## 2016-03-31 ENCOUNTER — Encounter: Payer: Self-pay | Admitting: Obstetrics and Gynecology

## 2016-03-31 ENCOUNTER — Ambulatory Visit (INDEPENDENT_AMBULATORY_CARE_PROVIDER_SITE_OTHER): Payer: BLUE CROSS/BLUE SHIELD | Admitting: Obstetrics and Gynecology

## 2016-03-31 VITALS — BP 112/70 | HR 80 | Resp 14 | Ht 63.0 in | Wt 135.6 lb

## 2016-03-31 DIAGNOSIS — Z01419 Encounter for gynecological examination (general) (routine) without abnormal findings: Secondary | ICD-10-CM

## 2016-03-31 DIAGNOSIS — N393 Stress incontinence (female) (male): Secondary | ICD-10-CM

## 2016-03-31 NOTE — Patient Instructions (Signed)
Health Maintenance, Female Adopting a healthy lifestyle and getting preventive care can go a long way to promote health and wellness. Talk with your health care provider about what schedule of regular examinations is right for you. This is a good chance for you to check in with your provider about disease prevention and staying healthy. In between checkups, there are plenty of things you can do on your own. Experts have done a lot of research about which lifestyle changes and preventive measures are most likely to keep you healthy. Ask your health care provider for more information. WEIGHT AND DIET  Eat a healthy diet  Be sure to include plenty of vegetables, fruits, low-fat dairy products, and lean protein.  Do not eat a lot of foods high in solid fats, added sugars, or salt.  Get regular exercise. This is one of the most important things you can do for your health.  Most adults should exercise for at least 150 minutes each week. The exercise should increase your heart rate and make you sweat (moderate-intensity exercise).  Most adults should also do strengthening exercises at least twice a week. This is in addition to the moderate-intensity exercise.  Maintain a healthy weight  Body mass index (BMI) is a measurement that can be used to identify possible weight problems. It estimates body fat based on height and weight. Your health care provider can help determine your BMI and help you achieve or maintain a healthy weight.  For females 42 years of age and older:   A BMI below 18.5 is considered underweight.  A BMI of 18.5 to 24.9 is normal.  A BMI of 25 to 29.9 is considered overweight.  A BMI of 30 and above is considered obese.  Watch levels of cholesterol and blood lipids  You should start having your blood tested for lipids and cholesterol at 57 years of age, then have this test every 5 years.  You may need to have your cholesterol levels checked more often if:  Your lipid  or cholesterol levels are high.  You are older than 57 years of age.  You are at high risk for heart disease.  CANCER SCREENING   Lung Cancer  Lung cancer screening is recommended for adults 66-82 years old who are at high risk for lung cancer because of a history of smoking.  A yearly low-dose CT scan of the lungs is recommended for people who:  Currently smoke.  Have quit within the past 15 years.  Have at least a 30-pack-year history of smoking. A pack year is smoking an average of one pack of cigarettes a day for 1 year.  Yearly screening should continue until it has been 15 years since you quit.  Yearly screening should stop if you develop a health problem that would prevent you from having lung cancer treatment.  Breast Cancer  Practice breast self-awareness. This means understanding how your breasts normally appear and feel.  It also means doing regular breast self-exams. Let your health care provider know about any changes, no matter how small.  If you are in your 20s or 30s, you should have a clinical breast exam (CBE) by a health care provider every 1-3 years as part of a regular health exam.  If you are 32 or older, have a CBE every year. Also consider having a breast X-ray (mammogram) every year.  If you have a family history of breast cancer, talk to your health care provider about genetic screening.  If you  are at high risk for breast cancer, talk to your health care provider about having an MRI and a mammogram every year.  Breast cancer gene (BRCA) assessment is recommended for women who have family members with BRCA-related cancers. BRCA-related cancers include:  Breast.  Ovarian.  Tubal.  Peritoneal cancers.  Results of the assessment will determine the need for genetic counseling and BRCA1 and BRCA2 testing. Cervical Cancer Your health care provider may recommend that you be screened regularly for cancer of the pelvic organs (ovaries, uterus, and  vagina). This screening involves a pelvic examination, including checking for microscopic changes to the surface of your cervix (Pap test). You may be encouraged to have this screening done every 3 years, beginning at age 21.  For women ages 30-65, health care providers may recommend pelvic exams and Pap testing every 3 years, or they may recommend the Pap and pelvic exam, combined with testing for human papilloma virus (HPV), every 5 years. Some types of HPV increase your risk of cervical cancer. Testing for HPV may also be done on women of any age with unclear Pap test results.  Other health care providers may not recommend any screening for nonpregnant women who are considered low risk for pelvic cancer and who do not have symptoms. Ask your health care provider if a screening pelvic exam is right for you.  If you have had past treatment for cervical cancer or a condition that could lead to cancer, you need Pap tests and screening for cancer for at least 20 years after your treatment. If Pap tests have been discontinued, your risk factors (such as having a new sexual partner) need to be reassessed to determine if screening should resume. Some women have medical problems that increase the chance of getting cervical cancer. In these cases, your health care provider may recommend more frequent screening and Pap tests. Colorectal Cancer  This type of cancer can be detected and often prevented.  Routine colorectal cancer screening usually begins at 57 years of age and continues through 57 years of age.  Your health care provider may recommend screening at an earlier age if you have risk factors for colon cancer.  Your health care provider may also recommend using home test kits to check for hidden blood in the stool.  A small camera at the end of a tube can be used to examine your colon directly (sigmoidoscopy or colonoscopy). This is done to check for the earliest forms of colorectal  cancer.  Routine screening usually begins at age 50.  Direct examination of the colon should be repeated every 5-10 years through 57 years of age. However, you may need to be screened more often if early forms of precancerous polyps or small growths are found. Skin Cancer  Check your skin from head to toe regularly.  Tell your health care provider about any new moles or changes in moles, especially if there is a change in a mole's shape or color.  Also tell your health care provider if you have a mole that is larger than the size of a pencil eraser.  Always use sunscreen. Apply sunscreen liberally and repeatedly throughout the day.  Protect yourself by wearing long sleeves, pants, a wide-brimmed hat, and sunglasses whenever you are outside. HEART DISEASE, DIABETES, AND HIGH BLOOD PRESSURE   High blood pressure causes heart disease and increases the risk of stroke. High blood pressure is more likely to develop in:  People who have blood pressure in the high end   of the normal range (130-139/85-89 mm Hg).  People who are overweight or obese.  People who are African American.  If you are 38-23 years of age, have your blood pressure checked every 3-5 years. If you are 61 years of age or older, have your blood pressure checked every year. You should have your blood pressure measured twice--once when you are at a hospital or clinic, and once when you are not at a hospital or clinic. Record the average of the two measurements. To check your blood pressure when you are not at a hospital or clinic, you can use:  An automated blood pressure machine at a pharmacy.  A home blood pressure monitor.  If you are between 45 years and 39 years old, ask your health care provider if you should take aspirin to prevent strokes.  Have regular diabetes screenings. This involves taking a blood sample to check your fasting blood sugar level.  If you are at a normal weight and have a low risk for diabetes,  have this test once every three years after 57 years of age.  If you are overweight and have a high risk for diabetes, consider being tested at a younger age or more often. PREVENTING INFECTION  Hepatitis B  If you have a higher risk for hepatitis B, you should be screened for this virus. You are considered at high risk for hepatitis B if:  You were born in a country where hepatitis B is common. Ask your health care provider which countries are considered high risk.  Your parents were born in a high-risk country, and you have not been immunized against hepatitis B (hepatitis B vaccine).  You have HIV or AIDS.  You use needles to inject street drugs.  You live with someone who has hepatitis B.  You have had sex with someone who has hepatitis B.  You get hemodialysis treatment.  You take certain medicines for conditions, including cancer, organ transplantation, and autoimmune conditions. Hepatitis C  Blood testing is recommended for:  Everyone born from [birth year] through 1967.  Anyone with known risk factors for hepatitis C. Sexually transmitted infections (STIs)  You should be screened for sexually transmitted infections (STIs) including gonorrhea and chlamydia if:  You are sexually active and are younger than 57 years of age.  You are older than 57 years of age and your health care provider tells you that you are at risk for this type of infection.  Your sexual activity has changed since you were last screened and you are at an increased risk for chlamydia or gonorrhea. Ask your health care provider if you are at risk.  If you do not have HIV, but are at risk, it may be recommended that you take a prescription medicine daily to prevent HIV infection. This is called pre-exposure prophylaxis (PrEP). You are considered at risk if:  You are sexually active and do not regularly use condoms or know the HIV status of your partner(s).  You take drugs by injection.  You are sexually  active with a partner who has HIV. Talk with your health care provider about whether you are at high risk of being infected with HIV. If you choose to begin PrEP, you should first be tested for HIV. You should then be tested every 3 months for as long as you are taking PrEP.  PREGNANCY   If you are premenopausal and you may become pregnant, ask your health care provider about preconception counseling.  If you may  become pregnant, take 400 to 800 micrograms (mcg) of folic acid every day.  If you want to prevent pregnancy, talk to your health care provider about birth control (contraception). OSTEOPOROSIS AND MENOPAUSE   Osteoporosis is a disease in which the bones lose minerals and strength with aging. This can result in serious bone fractures. Your risk for osteoporosis can be identified using a bone density scan.  If you are 65 years of age or older, or if you are at risk for osteoporosis and fractures, ask your health care provider if you should be screened.  Ask your health care provider whether you should take a calcium or vitamin D supplement to lower your risk for osteoporosis.  Menopause may have certain physical symptoms and risks.  Hormone replacement therapy may reduce some of these symptoms and risks. Talk to your health care provider about whether hormone replacement therapy is right for you.  HOME CARE INSTRUCTIONS   Schedule regular health, dental, and eye exams.  Stay current with your immunizations.   Do not use any tobacco products including cigarettes, chewing tobacco, or electronic cigarettes.  If you are pregnant, do not drink alcohol.  If you are breastfeeding, limit how much and how often you drink alcohol.  Limit alcohol intake to no more than 1 drink per day for nonpregnant women. One drink equals 12 ounces of beer, 5 ounces of wine, or 1 ounces of hard liquor.  Do not use street drugs.  Do not share needles.  Ask your health care provider for help if  you need support or information about quitting drugs.  Tell your health care provider if you often feel depressed.  Tell your health care provider if you have ever been abused or do not feel safe at home.   This information is not intended to replace advice given to you by your health care provider. Make sure you discuss any questions you have with your health care provider.   Document Released: 05/15/2011 Document Revised: 11/20/2014 Document Reviewed: 10/01/2013 Elsevier Interactive Patient Education 2016 [employer]  Menopause and Herbal Products WHAT IS MENOPAUSE? Menopause is the normal time of life when menstrual periods decrease in frequency and eventually stop completely. This process can take several years for some women. Menopause is complete when you have had an absence of menstruation for a full year since your last menstrual period. It usually occurs between the ages of 48 and 55. It is not common for menopause to begin before the age of 40. During menopause, your body stops producing the female hormones estrogen and progesterone. Common symptoms associated with this loss of hormones (vasomotor symptoms) are:  Hot flashes.  Hot flushes.  Night sweats. Other common symptoms and complications of menopause include:  Decrease in sex drive.  Vaginal dryness and thinning of the walls of the vagina. This can make sex painful.  Dryness of the skin and development of wrinkles.  Headaches.  Tiredness.  Irritability.  Memory problems.  Weight gain.  Bladder infections.  Hair growth on the face and chest.  Inability to reproduce offspring (infertility).  Loss of density in the bones (osteoporosis) increasing your risk for breaks (fractures).  Depression.  Hardening and narrowing of the arteries (atherosclerosis). This increases your risk of heart attack and stroke. WHAT TREATMENT OPTIONS ARE AVAILABLE? There are many treatment choices for menopause symptoms. The  most common treatment is hormone replacement therapy. Many alternative therapies for menopause are emerging, including the use of herbal products. These supplements can   be found in the form of herbs, teas, oils, tinctures, and pills. Common herbal supplements for menopause are made from plants that contain phytoestrogens. Phytoestrogens are compounds that occur naturally in plants and plant products. They act like estrogen in the body. Foods and herbs that contain phytoestrogens include:  Soy.  Flax seeds.  Red clover.  Ginseng. WHAT MENOPAUSE SYMPTOMS MAY BE HELPED IF I USE HERBAL PRODUCTS?  Vasomotor symptoms. These may be helped by:  Soy. Some studies show that soy may have a moderate benefit for hot flashes.  Black cohosh. There is limited evidence indicating this may be beneficial for hot flashes.  Symptoms that are related to heart and blood vessel disease. These may be helped by soy. Studies have shown that soy can help to lower cholesterol.  Depression. This may be helped by:  St. John's wort. There is limited evidence that shows this may help mild to moderate depression.  Black cohosh. There is evidence that this may help depression and mood swings.  Osteoporosis. Soy may help to decrease bone loss that is associated with menopause and may prevent osteoporosis. Limited evidence indicates that red clover may offer some bone loss protection as well. Other herbal products that are commonly used during menopause lack enough evidence to support their use as a replacement for conventional menopause therapies. These products include evening primrose, ginseng, and red clover. WHAT ARE THE CASES WHEN HERBAL PRODUCTS SHOULD NOT BE USED DURING MENOPAUSE? Do not use herbal products during menopause without your health care provider's approval if:  You are taking medicine.  You have a preexisting liver condition. ARE THERE ANY RISKS IN MY TAKING HERBAL PRODUCTS DURING MENOPAUSE? If you  choose to use herbal products to help with symptoms of menopause, keep in mind that:  Different supplements have different and unmeasured amounts of herbal ingredients.  Herbal products are not regulated the same way that medicines are.  Concentrations of herbs may vary depending on the way they are prepared. For example, the concentration may be different in a pill, tea, oil, and tincture.  Little is known about the risks of using herbal products, particularly the risks of long-term use.  Some herbal supplements can be harmful when combined with certain medicines. Most commonly reported side effects of herbal products are mild. However, if used improperly, many herbal supplements can cause serious problems. Talk to your health care provider before starting any herbal product. If problems develop, stop taking the supplement and let your health care provider know.   This information is not intended to replace advice given to you by your health care provider. Make sure you discuss any questions you have with your health care provider.   Document Released: 04/17/2008 Document Revised: 11/20/2014 Document Reviewed: 04/14/2014 Elsevier Interactive Patient Education 2016 [employer]  

## 2016-03-31 NOTE — Progress Notes (Signed)
Patient ID: Madison Hart, female   DOB: May 25, 1961, 55 y.o.   MRN: [id number] 55 y.o. [id number] Married Caucasian female here for annual exam.    Loss of urine with sneeze or cough even if just emptied. Has urgency. Using protection.  Voids well.  Normal BMs and without constipation.  No vaginal bulge.    Hot flashes.  Trying to control with diet.   Had episode of PMB last year and had normal pelvic ultrasound.  No EMB done.  No further bleeding.   Has allergies and takes OTC Chlortrimeton.  Has acid reflux also. Notes with dietary choices. Feels symptoms are throat and chest symptoms. Wants help with her allergies/reflux.  Mother will be coming to live with patient and her husband.   PCP: Colin Benton, DO  Patient's last menstrual period was 11/13/2012 (approximate).           Sexually active: Yes.   female The current method of family planning is post menopausal status.    Exercising: Yes.    on and off with walking and hiking. Smoker:  no  Health Maintenance: Pap:  03-29-15 Neg:Neg HR HPV History of abnormal Pap:  no MMG:  04-21-15 3D/Density C/Neg/BiRads1:[facility] Colonoscopy:  NEVER.  Does IFOB with PCP.  BMD:  n/a Result  n/a TDaP:  PCP Gardasil:   N/A Hep C:  Declines. Screening Labs:  Hb today: PCP, Urine today: unable   reports that she has never smoked. She does not have any smokeless tobacco history on file. She reports that she does not drink alcohol or use illicit drugs.  Past Medical History  Diagnosis Date  . Fibromyalgia   . Allergy   . Depression   . Frequent headaches   . Migraine   . Urine incontinence   . Urinary tract infection   . Anxiety     Past Surgical History  Procedure Laterality Date  . Tonsillectomy    . Wisdom tooth extraction    . Lipoma excision  2006    left leg    Current Outpatient Prescriptions  Medication Sig Dispense Refill  . COD LIVER OIL PO Take by mouth.    Marland Kitchen ibuprofen (ADVIL,MOTRIN) 200 MG tablet Take  200 mg by mouth as needed.    Marland Kitchen LORazepam (ATIVAN) 0.5 MG tablet Take 1 tablet (0.5 mg total) by mouth 2 (two) times daily as needed for anxiety. 15 tablet 0  . Multiple Vitamins-Minerals (EMERGEN-C IMMUNE PO) Take by mouth.    . Probiotic Product (PROBIOTIC PO) Take by mouth.     No current facility-administered medications for this visit.    Family History  Problem Relation Age of Onset  . Cancer Father     mesothelioma - worked in Automotive engineer  . Hyperlipidemia Father   . Hypertension Father   . Mental illness Son   . Celiac disease Sister   . Migraines Sister   . Hyperlipidemia Mother     ROS:  Pertinent items are noted in HPI.  Otherwise, a comprehensive ROS was negative.  Exam:   BP 112/70 mmHg  Pulse 80  Resp 14  Ht 5\' 3"  (1.6 m)  Wt 135 lb 9.6 oz (61.508 kg)  BMI 24.03 kg/m2  LMP 11/13/2012 (Approximate)    General appearance: alert, cooperative and appears stated age Head: Normocephalic, without obvious abnormality, atraumatic Neck: no adenopathy, supple, symmetrical, trachea midline and thyroid normal to inspection and palpation Lungs: clear to auscultation bilaterally Breasts: normal appearance, no masses or tenderness,  Inspection negative, No nipple retraction or dimpling, No nipple discharge or bleeding, No axillary or supraclavicular adenopathy Heart: regular rate and rhythm Abdomen: incisions:  No.    , soft, non-tender; no masses, no organomegaly Extremities: extremities normal, atraumatic, no cyanosis or edema Skin: Skin color, texture, turgor normal. No rashes or lesions Lymph nodes: Cervical, supraclavicular, and axillary nodes normal. No abnormal inguinal nodes palpated Neurologic: Grossly normal  Pelvic: External genitalia:  no lesions              Urethra:  normal appearing urethra with no masses, tenderness or lesions              Bartholins and Skenes: normal                 Vagina: normal appearing vagina with normal color and discharge, no  lesions.  Good support.               Cervix: no lesions              Pap taken: No. Bimanual Exam:  Uterus:  normal size, contour, position, consistency, mobility, non-tender              Adnexa: normal adnexa and no mass, fullness, tenderness              Rectal exam: Yes.  .  Confirms.              Anus:  normal sphincter tone, no lesions  Chaperone was present for exam.  Assessment:   Well woman visit with normal exam. Genuine stress incontinence. Env allergies/reflux.   Plan: Yearly mammogram recommended.  Discussed 3D mammograms - risks and benefits. Recommended self breast exam.  Pap and HR HPV as above. Recommendations for Calcium, Vitamin D, regular exercise program including cardiovascular and weight bearing exercise. Labs performed.  No..  Labs with PCP. Prescription medication(s) given.  No..    Discussed colon cancer screening guidelines.  Discussed pelvic floor PT, Impressa, pessary, and surgery for urinary stress incontinence.  Patient will return for pessary fitting.  She declines pelvic floor PT.  Will return to PCP for evaluation and tx of env allergies and reflux symptoms.   Follow up annually and prn.   After visit summary provided.

## 2016-04-03 ENCOUNTER — Encounter: Payer: Self-pay | Admitting: Obstetrics and Gynecology

## 2016-04-03 ENCOUNTER — Ambulatory Visit (INDEPENDENT_AMBULATORY_CARE_PROVIDER_SITE_OTHER): Payer: BLUE CROSS/BLUE SHIELD | Admitting: Obstetrics and Gynecology

## 2016-04-03 VITALS — BP 118/74 | HR 80 | Resp 16 | Ht 63.0 in | Wt 135.0 lb

## 2016-04-03 DIAGNOSIS — N393 Stress incontinence (female) (male): Secondary | ICD-10-CM

## 2016-04-03 NOTE — Progress Notes (Signed)
GYNECOLOGY  VISIT   HPI: 55 y.o.   Married  Caucasian  female   [phone] with Patient's last menstrual period was 11/13/2012 (approximate).   here for   Pessary fitting.  Has urinary stress incontinence and wants to try pessary first for treating this.   GYNECOLOGIC HISTORY: Patient's last menstrual period was 11/13/2012 (approximate). Contraception:  Post menopausal Menopausal hormone therapy:  none Last mammogram:  04-21-15 3D/Density C/Neg/BiRads1:[facility] Last pap smear:   03-29-15 Neg:Neg HR HPV        OB History    Gravida Para Term Preterm AB TAB SAB Ectopic Multiple Living   3 3 3       3          Patient Active Problem List   Diagnosis Date Noted  . Urinary incontinence 09/22/2014  . GAD (generalized anxiety disorder) 09/22/2014  . Panic disorder 09/22/2014  . Fibromyalgia 09/22/2014    Past Medical History  Diagnosis Date  . Fibromyalgia   . Allergy   . Depression   . Frequent headaches   . Migraine   . Urine incontinence   . Urinary tract infection   . Anxiety     Past Surgical History  Procedure Laterality Date  . Tonsillectomy    . Wisdom tooth extraction    . Lipoma excision  2006    left leg    Current Outpatient Prescriptions  Medication Sig Dispense Refill  . COD LIVER OIL PO Take by mouth.    Marland Kitchen ibuprofen (ADVIL,MOTRIN) 200 MG tablet Take 200 mg by mouth as needed.    Marland Kitchen LORazepam (ATIVAN) 0.5 MG tablet Take 1 tablet (0.5 mg total) by mouth 2 (two) times daily as needed for anxiety. 15 tablet 0  . Multiple Vitamins-Minerals (EMERGEN-C IMMUNE PO) Take by mouth.    . Probiotic Product (PROBIOTIC PO) Take by mouth.    . Triprolidine-Pseudoephedrine (ANTIHISTAMINE PO) Take 1 tablet by mouth as needed.     No current facility-administered medications for this visit.     ALLERGIES: Claritin; Detrol; Erythromycin; and Penicillins  Family History  Problem Relation Age of Onset  . Cancer Father     mesothelioma - worked in Automotive engineer   . Hyperlipidemia Father   . Hypertension Father   . Mental illness Son   . Celiac disease Sister   . Migraines Sister   . Hyperlipidemia Mother   . Alzheimer's disease Mother     Social History   Social History  . Marital Status: Married    Spouse Name: N/A  . Number of Children: N/A  . Years of Education: N/A   Occupational History  . Not on file.   Social History Main Topics  . Smoking status: Never Smoker   . Smokeless tobacco: Not on file  . Alcohol Use: No  . Drug Use: No  . Sexual Activity:    Partners: Male    Birth Control/ Protection: Post-menopausal   Other Topics Concern  . 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  PHYSICAL EXAMINATION:    BP 118/74 mmHg  Pulse 80  Resp 16  Ht 5\' 3"  (1.6 m)  Wt 135 lb (61.236 kg)  BMI 23.92 kg/m2  LMP 11/13/2012 (Approximate)    General appearance: alert, cooperative and appears stated age   Pelvic: External genitalia:  no lesions              Urethra:  normal appearing urethra with no masses, tenderness or lesions              Bartholins and Skenes: normal                 Vagina: normal appearing vagina with normal color and discharge, no lesions.  First degree cystocele.           Bimanual Exam:  Uterus:  normal size, contour, position, consistency, mobility, non-tender              Adnexa: normal adnexa and no mass, fullness, tenderness          Stress incontinence demonstrated with valsalva and ring pessary with support.  65 mm incontinence dish used and patient comfortable, able to maintain pessary with doing maneuvers, and did not have urinary incontinence with Valsalva.  Chaperone was present for  exam.  ASSESSMENT  Genuine stress incontinence.  First degree cystocele.   PLAN  Will proceed with ordering pessary.    Follow up when pessary arrives.  OK to continue pelvic floor strengthening exercises.     An After Visit Summary was printed and given to the patient.  _15_____ minutes face to face time of which over 50% was spent in counseling.

## 2016-04-04 ENCOUNTER — Telehealth: Payer: Self-pay | Admitting: *Deleted

## 2016-04-04 NOTE — Telephone Encounter (Signed)
I called the Madison Hart and left a detailed message for her to call the office to schedule an appt to see Dr Maudie Mercury for allergies and reflux.

## 2016-04-07 NOTE — Telephone Encounter (Signed)
See prior note

## 2016-04-11 ENCOUNTER — Telehealth: Payer: Self-pay | Admitting: *Deleted

## 2016-04-11 NOTE — Telephone Encounter (Signed)
Call to patient. Notified pessary has arrived. Insertion scheduled for Thursday, 04-13-16 at 2pm with Dr Quincy Simmonds.  Routing to provider for final review. Patient agreeable to disposition. Will close encounter.

## 2016-04-13 ENCOUNTER — Encounter: Payer: Self-pay | Admitting: Obstetrics and Gynecology

## 2016-04-13 ENCOUNTER — Ambulatory Visit (INDEPENDENT_AMBULATORY_CARE_PROVIDER_SITE_OTHER): Payer: BLUE CROSS/BLUE SHIELD | Admitting: Obstetrics and Gynecology

## 2016-04-13 VITALS — BP 114/70 | HR 76 | Ht 63.0 in | Wt 136.4 lb

## 2016-04-13 DIAGNOSIS — N393 Stress incontinence (female) (male): Secondary | ICD-10-CM | POA: Diagnosis not present

## 2016-04-13 MED ORDER — SURGILUBE EX GEL: CUTANEOUS | Status: DC | PRN

## 2016-04-13 NOTE — Progress Notes (Signed)
Patient ID: Madison Hart, female   DOB: 23-Nov-1960, 55 y.o.   MRN: [id number] GYNECOLOGY  VISIT   HPI: 55 y.o.   Married  Caucasian  female   [phone] with Patient's last menstrual period was 11/13/2012 (approximate).   here for pessary insertion.   GYNECOLOGIC HISTORY: Patient's last menstrual period was 11/13/2012 (approximate). Contraception:  Postmenopausal Menopausal hormone therapy:  n/a Last mammogram: 04-21-15 3D/Density C/Neg/BiRads1:[facility]  Last pap smear:  03-29-15 Neg:Neg HR HPV        OB History    Gravida Para Term Preterm AB TAB SAB Ectopic Multiple Living   3 3 3       3          Patient Active Problem List   Diagnosis Date Noted  . Urinary incontinence 09/22/2014  . GAD (generalized anxiety disorder) 09/22/2014  . Panic disorder 09/22/2014  . Fibromyalgia 09/22/2014    Past Medical History  Diagnosis Date  . Fibromyalgia   . Allergy   . Depression   . Frequent headaches   . Migraine   . Urine incontinence   . Urinary tract infection   . Anxiety     Past Surgical History  Procedure Laterality Date  . Tonsillectomy    . Wisdom tooth extraction    . Lipoma excision  2006    left leg    Current Outpatient Prescriptions  Medication Sig Dispense Refill  . COD LIVER OIL PO Take by mouth.    Marland Kitchen ibuprofen (ADVIL,MOTRIN) 200 MG tablet Take 200 mg by mouth as needed.    Marland Kitchen LORazepam (ATIVAN) 0.5 MG tablet Take 1 tablet (0.5 mg total) by mouth 2 (two) times daily as needed for anxiety. 15 tablet 0  . Multiple Vitamins-Minerals (EMERGEN-C IMMUNE PO) Take by mouth.    . Probiotic Product (PROBIOTIC PO) Take by mouth.    . Triprolidine-Pseudoephedrine (ANTIHISTAMINE PO) Take 1 tablet by mouth as needed.     No current facility-administered medications for this visit.     ALLERGIES: Claritin; Detrol; Erythromycin; and Penicillins  Family History  Problem Relation Age of Onset  . Cancer Father     mesothelioma - worked in Automotive engineer  .  Hyperlipidemia Father   . Hypertension Father   . Mental illness Son   . Celiac disease Sister   . Migraines Sister   . Hyperlipidemia Mother   . Alzheimer's disease Mother     Social History   Social History  . Marital Status: Married    Spouse Name: N/A  . Number of Children: N/A  . Years of Education: N/A   Occupational History  . Not on file.   Social History Main Topics  . Smoking status: Never Smoker   . Smokeless tobacco: Not on file  . Alcohol Use: No  . Drug Use: No  . Sexual Activity:    Partners: Male    Birth Control/ Protection: Post-menopausal   Other Topics Concern  . 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  PHYSICAL EXAMINATION:    BP 114/70 mmHg  Pulse 76  Ht 5\' 3"  (1.6 m)  Wt 136 lb 6.4 oz (61.871 kg)  BMI 24.17 kg/m2  LMP 11/13/2012 (Approximate)    General appearance: alert, cooperative and appears stated age   Pelvic: External genitalia:  no lesions              Urethra:  normal appearing urethra with no masses, tenderness or lesions              Bartholins and Skenes: normal          Bimanual Exam:  Uterus:  normal size, contour, position, consistency, mobility, non-tender              Adnexa: normal adnexa and no mass, fullness, tenderness            Pessary - 65 mm incontinence dish. [place] number [id number].   Pessary comfortable for patient and she is able to do physical maneuvers in the exam room and maintain the pessary in place. She can both place and remove the pessary and understands how to place the support area under the urethra anteriorly in order to have proper orientation of the pessary. She was able to void with the  pessary in prior to leaving the office today.  Chaperone was present for exam.  ASSESSMENT  Urinary incontinence.  Pessary placement today.  PLAN  Instructed in pessary use.  May still want to wear a pad until she gets to know the pessary better. Follow up in approximately one week. Call for bleeding, pain, or heavy discharge.   An After Visit Summary was printed and given to the patient.  __15____ minutes face to face time of which over 50% was spent in counseling.

## 2016-04-14 ENCOUNTER — Telehealth: Payer: Self-pay

## 2016-04-14 NOTE — Telephone Encounter (Signed)
Yes, she needs the bacteriostatic lubricant.  The OTC options cause burning.

## 2016-04-14 NOTE — Telephone Encounter (Signed)
Spoke with [facility] at time of incoming call. [facility] is calling to clarify if the patient needs a surgical lubricant that is bacteriostatic or if she can use an OTC lubricant? Advised I will speak with Dr.Silva and return call with further information.

## 2016-04-17 NOTE — Telephone Encounter (Signed)
Spoke with the pharmacist at [facility] of message as seen below from [place]. She is agreeable and verbalizes understanding.  Routing to provider for final review. Patient agreeable to disposition. Will close encounter.

## 2016-04-18 ENCOUNTER — Encounter: Payer: Managed Care, Other (non HMO) | Admitting: Family Medicine

## 2016-04-18 ENCOUNTER — Telehealth: Payer: Self-pay | Admitting: Obstetrics and Gynecology

## 2016-04-18 NOTE — Telephone Encounter (Signed)
Left patient a message to call back to reschedule a future appointment that was cancelled by the provider. °

## 2016-04-19 ENCOUNTER — Ambulatory Visit: Payer: BLUE CROSS/BLUE SHIELD | Admitting: Obstetrics and Gynecology

## 2016-04-20 ENCOUNTER — Ambulatory Visit: Payer: BLUE CROSS/BLUE SHIELD | Admitting: Obstetrics and Gynecology

## 2016-04-26 ENCOUNTER — Ambulatory Visit: Payer: BLUE CROSS/BLUE SHIELD | Admitting: Obstetrics and Gynecology

## 2016-05-04 ENCOUNTER — Ambulatory Visit (INDEPENDENT_AMBULATORY_CARE_PROVIDER_SITE_OTHER): Payer: BLUE CROSS/BLUE SHIELD | Admitting: Obstetrics and Gynecology

## 2016-05-04 ENCOUNTER — Encounter: Payer: Self-pay | Admitting: Obstetrics and Gynecology

## 2016-05-04 VITALS — BP 118/70 | HR 76 | Ht 63.0 in | Wt 137.0 lb

## 2016-05-04 DIAGNOSIS — N393 Stress incontinence (female) (male): Secondary | ICD-10-CM

## 2016-05-04 DIAGNOSIS — N812 Incomplete uterovaginal prolapse: Secondary | ICD-10-CM

## 2016-05-04 NOTE — Progress Notes (Signed)
Patient ID: Madison Hart, female   DOB: Jul 25, 1961, 55 y.o.   MRN: [id number] GYNECOLOGY  VISIT   HPI: 55 y.o.   Married  Caucasian  female   [phone] with Patient's last menstrual period was 11/13/2012 (approximate).   here for pessary check.  Patient states pessary is not staying in place.  Pessary is shifting in the vagina.  Urinary incontinence is no better. It is actually worse.  Feels pressure.  Pessary drops with BMs. Comes down with exercise as well.  Feels better with pessary out.   Has 65 mm incontinence dish.   Known first degree cystocele.  Would like to really be much more active.   Mother and 2 sisters had surgery for prolapse and incontinence of urine. Some have had recurrence of the issues following surgical care.  GYNECOLOGIC HISTORY: Patient's last menstrual period was 11/13/2012 (approximate). Contraception:  Postmenopausal Menopausal hormone therapy:  none Last mammogram:  04-21-15 3D/Density C/Neg/BiRads1:[facility] Last pap smear:   03-29-15 Neg:Neg HR HPV        OB History    Gravida Para Term Preterm AB TAB SAB Ectopic Multiple Living   3 3 3       3          Patient Active Problem List   Diagnosis Date Noted  . Urinary incontinence 09/22/2014  . GAD (generalized anxiety disorder) 09/22/2014  . Panic disorder 09/22/2014  . Fibromyalgia 09/22/2014    Past Medical History  Diagnosis Date  . Fibromyalgia   . Allergy   . Depression   . Frequent headaches   . Migraine   . Urine incontinence   . Urinary tract infection   . Anxiety     Past Surgical History  Procedure Laterality Date  . Tonsillectomy    . Wisdom tooth extraction    . Lipoma excision  2006    left leg    Current Outpatient Prescriptions  Medication Sig Dispense Refill  . COD LIVER OIL PO Take by mouth.    Marland Kitchen ibuprofen (ADVIL,MOTRIN) 200 MG tablet Take 200 mg by mouth as needed.    Marland Kitchen LORazepam (ATIVAN) 0.5 MG tablet Take 1 tablet (0.5 mg total) by mouth 2 (two)  times daily as needed for anxiety. 15 tablet 0  . Lubricants (SURGICAL LUBRICANT) gel Apply topically as needed. 60 g 3  . Multiple Vitamins-Minerals (EMERGEN-C IMMUNE PO) Take by mouth.    . Probiotic Product (PROBIOTIC PO) Take by mouth.    . Triprolidine-Pseudoephedrine (ANTIHISTAMINE PO) Take 1 tablet by mouth as needed.     No current facility-administered medications for this visit.     ALLERGIES: Claritin; Detrol; Erythromycin; and Penicillins  Family History  Problem Relation Age of Onset  . Cancer Father     mesothelioma - worked in Automotive engineer  . Hyperlipidemia Father   . Hypertension Father   . Mental illness Son   . Celiac disease Sister   . Migraines Sister   . Hyperlipidemia Mother   . Alzheimer's disease Mother     Social History   Social History  . Marital Status: Married    Spouse Name: N/A  . Number of Children: N/A  . Years of Education: N/A   Occupational History  . Not on file.   Social History Main Topics  . Smoking status: Never Smoker   . Smokeless tobacco: Not on file  . Alcohol Use: No  . Drug Use: No  . Sexual Activity:    Partners: Male  Birth Control/ Protection: Post-menopausal   Other Topics Concern  . 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  PHYSICAL EXAMINATION:    BP 118/70 mmHg  Pulse 76  Ht 5\' 3"  (1.6 m)  Wt 137 lb (62.143 kg)  BMI 24.27 kg/m2  LMP 11/13/2012 (Approximate)    General appearance: alert, cooperative and appears stated age  Pelvic: External genitalia:  no lesions              Urethra:  normal appearing urethra with no masses, tenderness or lesions              Bartholins and Skenes: normal                  Vagina: normal appearing vagina with normal color and discharge, no lesions.  Second degree cystocele.  First degree uterine prolapse.  Minimal rectocele.              Cervix: no lesions                Bimanual Exam:  Uterus:  normal size, contour, position, consistency, mobility, non-tender              Adnexa: normal adnexa and no mass, fullness, tenderness          Incontinence dish in place and bladder prolapsing over the pessary.   With pessary out and patient standing, the prolapse is confirmed and she has a leak of urine with Valsalva.  Chaperone was present for exam.  ASSESSMENT  Incomplete uterovaginal prolapse.  Appears to be more progressive since last pessary was fitted. Genuine stress incontinence. Strong FH of pelvic organ prolapse and incontinence.  PLAN  I discussed options for the patient including having a fitting for a new pessary.  A larger pessary may work better, but I cannot guarantee this.  I offered referral to pelvic floor therapy.  I discussed surgical care and would lean toward an abdomino-sacrocolpopexy due to the patient's young age and interest in being physically active.  She states she does not want to do surgery until it is absolutely necessary.  Pessary cleansed and given to the patient in a bio bag. Follow for annual exams and prn.  An After Visit Summary was printed and given to the patient.  __25___ minutes face to face time of which over 50% was spent in counseling.

## 2016-05-04 NOTE — Patient Instructions (Signed)
Please let me know how I can help you!  We can do a pessary fitting for a larger pessary.   I can refer you to a great physical therapist for pelvic organ prolapse and urinary incontinence.

## 2016-05-09 ENCOUNTER — Encounter: Payer: Self-pay | Admitting: Family Medicine

## 2016-05-09 ENCOUNTER — Telehealth: Payer: Self-pay | Admitting: *Deleted

## 2016-05-09 ENCOUNTER — Ambulatory Visit (INDEPENDENT_AMBULATORY_CARE_PROVIDER_SITE_OTHER): Payer: BLUE CROSS/BLUE SHIELD | Admitting: Family Medicine

## 2016-05-09 VITALS — BP 118/80 | HR 82 | Temp 97.9°F | Ht 64.0 in | Wt 134.4 lb

## 2016-05-09 DIAGNOSIS — G43809 Other migraine, not intractable, without status migrainosus: Secondary | ICD-10-CM | POA: Diagnosis not present

## 2016-05-09 DIAGNOSIS — E785 Hyperlipidemia, unspecified: Secondary | ICD-10-CM | POA: Diagnosis not present

## 2016-05-09 DIAGNOSIS — J309 Allergic rhinitis, unspecified: Secondary | ICD-10-CM | POA: Diagnosis not present

## 2016-05-09 DIAGNOSIS — Z0001 Encounter for general adult medical examination with abnormal findings: Secondary | ICD-10-CM

## 2016-05-09 DIAGNOSIS — Z0000 Encounter for general adult medical examination without abnormal findings: Secondary | ICD-10-CM

## 2016-05-09 LAB — LIPID PANEL
CHOLESTEROL: 218 mg/dL — AB (ref 0–200)
HDL: 47.9 mg/dL (ref >39.00)
LDL Cholesterol: 151 mg/dL — ABNORMAL HIGH (ref 0–99)
NONHDL: 169.63
TRIGLYCERIDES: 94 mg/dL (ref 0.0–149.0)
Total CHOL/HDL Ratio: 5
VLDL: 18.8 mg/dL (ref 0.0–40.0)

## 2016-05-09 LAB — HEMOGLOBIN A1C: Hgb A1c MFr Bld: 5.3 % (ref 4.6–6.5)

## 2016-05-09 MED ORDER — SUMATRIPTAN SUCCINATE 25 MG PO TABS: 25.0000 mg | ORAL_TABLET | ORAL | Status: DC | PRN

## 2016-05-09 NOTE — Progress Notes (Signed)
Pre visit review using our clinic review tool, if applicable. No additional management support is needed unless otherwise documented below in the visit note. 

## 2016-05-09 NOTE — Telephone Encounter (Signed)
Printed Rx for Sumatriptan was called to [facility].

## 2016-05-09 NOTE — Progress Notes (Signed)
HPI:  Here for CPE, and has multiple new issues to discuss that she wants to discuss today.   -Concerns and/or follow up today:  History of hyperlipidemia and hyperglycemia and poor compliance with recommendations. Refused medication or recheck last visit 9 months ago. She had a friend whom "almost died" from taking a statin medication and she does not want to take medications for her cholesterol.  Seasonal allergies. Worse in the spring and the fall. Nasal, eye symptoms and a cough. She tried claritin and did not tolerate. She wants to take a medication for this and wants to know options.  Migraines, chronic for her whole life. FH migraines. Always triggered by particular foods, but she is not sure what, only occurs when she eats out. Migraines are R sided can last 1 day to 2 days, sometimes accompanied by nausea and light sensitivity. No vomiting. Wants to try abortive medication. Also interested in supplements. No change in character or frequency. Occur 3-4 times per months depending on how often she goes out.  Wonders if there is a test for nutritional deficiencies that would explain her sensitivity to allergy and other medications. Wonders about integrative health clinic testing.  -Diet: variety of foods, balance and well rounded - eating healthy  -Exercise: no regular aerobic exercise  -Taking folic acid, vitamin D or calcium: no - she reports all vit D products make her fibromyalgia worse  -Diabetes and Dyslipidemia Screening: FASTING  -Hx of HTN: no  -Vaccines: UTD  -pap history: sees gyn for gyn physicals  -FDLMP: n/a  -sexual activity: yes, female partner, no new partners  -wants STI testing (Hep C if born [birth year]): no  -FH breast, colon or ovarian ca: see FH Last mammogram: due, does with breast center Last colon cancer screening: did stool cards last year, after discussion options she is interested in cologuard  -Alcohol, Tobacco, drug use: see social  history  Review of Systems - no fevers, unintentional weight loss, vision loss, hearing loss, chest pain, sob, hemoptysis, melena, hematochezia, hematuria, genital discharge, changing or concerning skin lesions, bleeding, bruising, loc, thoughts of self harm or SI  Past Medical History  Diagnosis Date  . Fibromyalgia   . Allergy   . Depression   . Frequent headaches   . Migraine   . Urine incontinence   . Urinary tract infection   . Anxiety     Past Surgical History  Procedure Laterality Date  . Tonsillectomy    . Wisdom tooth extraction    . Lipoma excision  2006    left leg    Family History  Problem Relation Age of Onset  . Cancer Father     mesothelioma - worked in Automotive engineer  . Hyperlipidemia Father   . Hypertension Father   . Mental illness Son   . Celiac disease Sister   . Migraines Sister   . Hyperlipidemia Mother   . Alzheimer's disease Mother     Social History   Social History  . Marital Status: Married    Spouse Name: N/A  . Number of Children: N/A  . Years of Education: N/A   Social History Main Topics  . Smoking status: Never Smoker   . Smokeless tobacco: None  . Alcohol Use: No  . Drug Use: No  . Sexual Activity:    Partners: Male    Birth Control/ Protection: Post-menopausal   Other Topics Concern  . 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  Chlorpheniramine Maleate (CHLOR-TRIMETON ALLERGY PO), Take by mouth as needed., Disp: , Rfl:  .  COD LIVER OIL PO, Take by mouth., Disp: , Rfl:  .  ibuprofen (ADVIL,MOTRIN) 200 MG tablet, Take 200 mg by mouth as needed., Disp: , Rfl:  .  LORazepam (ATIVAN) 0.5 MG tablet, Take 1 tablet (0.5 mg total) by mouth 2 (two)  times daily as needed for anxiety., Disp: 15 tablet, Rfl: 0 .  Lubricants (SURGICAL LUBRICANT) gel, Apply topically as needed., Disp: 60 g, Rfl: 3 .  Multiple Vitamins-Minerals (EMERGEN-C IMMUNE PO), Take by mouth., Disp: , Rfl:  .  Probiotic Product (PROBIOTIC PO), Take by mouth., Disp: , Rfl:  .  SUMAtriptan (IMITREX) 25 MG tablet, Take 1 tablet (25 mg total) by mouth every 2 (two) hours as needed for migraine. May repeat dose in 2 hours if headache persists or recurs. No more then 2 doses in 24 hours., Disp: 10 tablet, Rfl: 0  EXAM:  Filed Vitals:   05/09/16 0754  BP: 118/80  Pulse: 82  Temp: 97.9 F (36.6 C)    GENERAL: vitals reviewed and listed below, alert, oriented, appears well hydrated and in no acute distress  HEENT: head atraumatic, PERRLA, normal appearance of eyes, ears, nose and mouth. moist mucus membranes.normal appearance of ear canals and TMs, clear nasal congestion, mild post oropharyngeal erythema with PND, no tonsillar edema or exudate, no sinus TTP  NECK: supple, no masses or lymphadenopathy  LUNGS: clear to auscultation bilaterally, no rales, rhonchi or wheeze  CV: HRRR, no peripheral edema or cyanosis, normal pedal pulses  BREAST: sees gyn  ABDOMEN: bowel sounds normal, soft, non tender to palpation, no masses, no rebound or guarding  GU: deferred, sees gyn  SKIN: no rash or abnormal lesions on arms, exposed areas legs or back  MS: normal gait, moves all extremities normally  NEURO: CN II-XII grossly intact, normal muscle strength and sensation to light touch on extremities  PSYCH: normal affect, pleasant and cooperative  ASSESSMENT AND PLAN:  Discussed the following assessment and plan:  1. Visit for preventive health examination -Discussed and advised all [place] preventive services health task force level A and B recommendations for age, sex and risks. -Advised at least 150 minutes of exercise per week and a healthy diet low in saturated fats and  sweets and consisting of fresh fruits and vegetables, lean meats such as fish and white chicken and whole grains. -does gyn physical with Dr. Quincy Simmonds -number provided to call for mammogram -she wants to check on cologuard, may do stool cards if not covered by insurance - cards given -labs, studies and vaccines per orders this encounter - Hemoglobin A1c  2. Allergic rhinitis, unspecified allergic rhinitis type -discussed options and risks various treatments, she opted to try INS and possibly adding different antihistamine - low dose zyrtec, consider singulair if this is not helping. -follow up 3 months  3. Other migraine without status migrainosus, not intractable -discussed various treatment options/risks -she wants to try triptan, rx sent -advised keeping migraine journal to try to further delineate triggers -follow up 3 months   4. Hyperlipemia -discussed tx options and risks, refuses medications -has not  been exercising, agreed to try to get more exercise - Lipid panel  Orders Placed This Encounter  Procedures  . Lipid panel  . Hemoglobin A1c   I don't do nutrient panel testing for drug intolerances. Did advise her of integrative health clinics that may be able to help.  Patient advised to return to clinic immediately if symptoms worsen or persist or new concerns.  Patient Instructions  BEFORE YOU LEAVE: -cologuard test information -  Allergies: -try flonase, 2 sprays daily for 1 month and then 1 spray each nostril daily -can try zyrtec at night as well  If you want to do the cologuard please let us know, otherwise please complete the stool cards provided  For migraines: -can try the imitrex at onset of migraine. Can repeat dose once in 24 hours. -keep journal -magnesium, butterbur, melatonin? Possibly helpful          No Follow-up on file.  Colin Benton R.

## 2016-05-09 NOTE — Patient Instructions (Addendum)
BEFORE YOU LEAVE: -cologuard test information -follow up in 3-4 months  Schedule your mammogram  Allergies: -try flonase, 2 sprays daily for 1 month and then 1 spray each nostril daily -can try zyrtec at night as well  If you want to do the cologuard please let us know, otherwise please complete the stool cards provided  For migraines: -can try the imitrex at onset of migraine. Can repeat dose once in 24 hours. -keep journal -magnesium, butterbur, melatonin? Possibly helpful  We recommend the following healthy lifestyle measures: - eat a healthy whole foods diet consisting of regular small meals composed of vegetables, fruits, beans, nuts, seeds, healthy meats such as white chicken and fish and whole grains.  - avoid sweets, white starchy foods, fried foods, fast food, processed foods, sodas, red meet and other fattening foods.  - get a least 150-300 minutes of aerobic exercise per week.    We have ordered labs or studies at this visit. It can take up to 1-2 weeks for results and processing. IF results require follow up or explanation, we will call you with instructions. Clinically stable results will be released to your [facility]. If you have not heard from [place] or cannot find your results in [facility] in 2 weeks please contact our office at [phone].  If you are not yet signed up for [facility], please consider signing up.

## 2016-05-11 ENCOUNTER — Encounter: Payer: Self-pay | Admitting: *Deleted

## 2016-05-29 ENCOUNTER — Other Ambulatory Visit: Payer: Self-pay | Admitting: Family Medicine

## 2016-05-29 DIAGNOSIS — Z1231 Encounter for screening mammogram for malignant neoplasm of breast: Secondary | ICD-10-CM

## 2016-06-08 ENCOUNTER — Ambulatory Visit
Admission: RE | Admit: 2016-06-08 | Discharge: 2016-06-08 | Disposition: A | Discharge status: Home / Self Care | Payer: BLUE CROSS/BLUE SHIELD | Source: Ambulatory Visit | Attending: Family Medicine | Admitting: Family Medicine

## 2016-06-08 ENCOUNTER — Other Ambulatory Visit: Payer: Self-pay | Admitting: Family Medicine

## 2016-06-08 DIAGNOSIS — Z1231 Encounter for screening mammogram for malignant neoplasm of breast: Secondary | ICD-10-CM

## 2016-07-13 ENCOUNTER — Encounter: Payer: Self-pay | Admitting: *Deleted

## 2016-07-13 ENCOUNTER — Telehealth: Payer: Self-pay | Admitting: Family Medicine

## 2016-07-13 NOTE — Telephone Encounter (Signed)
I have never heard of this requirement. It is a recommended preventive screening test. Ok to type letter that states that the patient was seen for a preventive care visit and that colon cancer screening was advised per current preventive care guidelines. One viable option for colon cancer screening is the cologuard test. Please let the patient know if this test is covered under her insurance plan. Thanks.

## 2016-07-13 NOTE — Telephone Encounter (Signed)
I called the pt and informed her of the message below.  Letter completed and faxed to [phone].

## 2016-07-13 NOTE — Telephone Encounter (Signed)
° °  Pt said when she last saw Dr Maudie Mercury she was told that she could have the cologuard test verses a colonospy. Pt call to say she contacted her her insurance comp said they told her they will need the following information  will need a letter of medical necessity . Attn  pre determination . Include group and id number.,cpt number  [id number]    Fax number ;[phone]

## 2016-08-30 ENCOUNTER — Telehealth: Payer: Self-pay | Admitting: Family Medicine

## 2016-08-30 NOTE — Telephone Encounter (Signed)
Pt state that her insurance is requesting a ICD-1 Code and would like to know if it has been sent back to the insurance company b/c she really would like to have this test cologuard done.

## 2016-09-01 NOTE — Telephone Encounter (Signed)
I called the pt and advised her the letter was faxed on 8/31 and I gave her the ICD-10 code that is listed on the forms we sent to [employer] for Cologuard which are Z12.11 and Z12.12 and advised she call her insurance with this information.  I advised she call her insurance company for coverage information as this is what [employer] recommends also and she agreed.

## 2016-09-08 ENCOUNTER — Ambulatory Visit: Payer: BLUE CROSS/BLUE SHIELD | Admitting: Family Medicine

## 2016-11-13 DIAGNOSIS — R7401 Elevation of levels of liver transaminase levels: Secondary | ICD-10-CM

## 2016-11-13 HISTORY — DX: Elevation of levels of liver transaminase levels: R74.01

## 2017-04-04 ENCOUNTER — Ambulatory Visit (INDEPENDENT_AMBULATORY_CARE_PROVIDER_SITE_OTHER): Payer: BLUE CROSS/BLUE SHIELD | Admitting: Obstetrics and Gynecology

## 2017-04-04 ENCOUNTER — Encounter: Payer: Self-pay | Admitting: Obstetrics and Gynecology

## 2017-04-04 VITALS — BP 120/74 | HR 72 | Resp 16 | Ht 63.75 in | Wt 134.8 lb

## 2017-04-04 DIAGNOSIS — N393 Stress incontinence (female) (male): Secondary | ICD-10-CM | POA: Diagnosis not present

## 2017-04-04 DIAGNOSIS — Z1211 Encounter for screening for malignant neoplasm of colon: Secondary | ICD-10-CM

## 2017-04-04 DIAGNOSIS — Z01419 Encounter for gynecological examination (general) (routine) without abnormal findings: Secondary | ICD-10-CM | POA: Diagnosis not present

## 2017-04-04 DIAGNOSIS — N812 Incomplete uterovaginal prolapse: Secondary | ICD-10-CM

## 2017-04-04 NOTE — Patient Instructions (Addendum)

## 2017-04-04 NOTE — Progress Notes (Signed)
56 y.o. G86P3003 Married Caucasian female here for annual exam.    Has uterovaginal prolapse and stress incontinence.  Pessary did not work well and stay in place. No change in prolapse per patient.  Still having urinary incontinence. Takes a homeopathic remedy for the urgency.  "Enuraid." Voiding and having BMs ok. Intercourse is still ok but dry.  Wants to know her surgical options.   No vaginal bleeding.   Mother is living at home.  Having some assistance at home.  No lifting of her mother.  Patient monitors her mother's care.   Sees Dr. Maudie Mercury yearly.  Has high cholesterol which she is controlling through diet alone.  PCP:  Dr. Colin Benton  Patient's last menstrual period was 11/13/2012 (approximate).           Sexually active: Yes.    The current method of family planning is post menopausal status.    Exercising: Yes.    Walking Smoker:  no  Health Maintenance: Pap:  03-29-15 Neg:Neg HR HPV History of abnormal Pap:  no MMG:  06/08/16 BIRADS 1 negative/density c Colonoscopy:  N/a.  Does not like the idea of doing this.  Checked into Cologuard.  Does stool card kits yearly. BMD:   n/a  Result  n/a TDaP:  ?PCP Gardasil:   n/a HIV: Completed Hep C: no Screening Labs: Patient would like labs done, but fasting.    reports that she has never smoked. She has never used smokeless tobacco. She reports that she does not drink alcohol or use drugs.  Past Medical History:  Diagnosis Date  . Allergy   . Anxiety   . Depression   . Fibromyalgia   . Frequent headaches   . Migraine   . Urinary tract infection   . Urine incontinence     Past Surgical History:  Procedure Laterality Date  . LIPOMA EXCISION  2006   left leg  . TONSILLECTOMY    . WISDOM TOOTH EXTRACTION      Current Outpatient Prescriptions  Medication Sig Dispense Refill  . Chlorpheniramine Maleate (CHLOR-TRIMETON ALLERGY PO) Take by mouth as needed.    . COD LIVER OIL PO Take by mouth.    Marland Kitchen ibuprofen  (ADVIL,MOTRIN) 200 MG tablet Take 200 mg by mouth as needed.    Marland Kitchen LORazepam (ATIVAN) 0.5 MG tablet Take 1 tablet (0.5 mg total) by mouth 2 (two) times daily as needed for anxiety. 15 tablet 0  . Lubricants (SURGICAL LUBRICANT) gel Apply topically as needed. 60 g 3  . Multiple Vitamins-Minerals (EMERGEN-C IMMUNE PO) Take by mouth.    . Probiotic Product (PROBIOTIC PO) Take by mouth.     No current facility-administered medications for this visit.     Family History  Problem Relation Age of Onset  . Cancer Father        mesothelioma - worked in Automotive engineer  . Hyperlipidemia Father   . Hypertension Father   . Mental illness Son   . Celiac disease Sister   . Migraines Sister   . Hyperlipidemia Mother   . Alzheimer's disease Mother     ROS:  Pertinent items are noted in HPI.  Otherwise, a comprehensive ROS was negative.  Exam:   BP 120/74 (BP Location: Right Arm, Patient Position: Sitting, Cuff Size: Normal)   Pulse 72   Resp 16   Ht 5' 3.75" (1.619 m)   Wt 134 lb 12.8 oz (61.1 kg)   LMP 11/13/2012 (Approximate)   BMI 23.32 kg/m  General appearance: alert, cooperative and appears stated age Head: Normocephalic, without obvious abnormality, atraumatic Neck: no adenopathy, supple, symmetrical, trachea midline and thyroid normal to inspection and palpation Lungs: clear to auscultation bilaterally Breasts: normal appearance, no masses or tenderness, No nipple retraction or dimpling, No nipple discharge or bleeding, No axillary or supraclavicular adenopathy Heart: regular rate and rhythm Abdomen: soft, non-tender; no masses, no organomegaly Extremities: extremities normal, atraumatic, no cyanosis or edema Skin: Skin color, texture, turgor normal. No rashes or lesions Lymph nodes: Cervical, supraclavicular, and axillary nodes normal. No abnormal inguinal nodes palpated Neurologic: Grossly normal  Pelvic: External genitalia:  no lesions              Urethra:  normal appearing  urethra with no masses, tenderness or lesions              Bartholins and Skenes: normal                 Vagina: normal appearing vagina with normal color and discharge, no lesions.  Valsalva showed leak of urine.               Cervix: no lesions  First degree uterine prolapse and almost second degree cystocele with Valsalva.  Good posterior vaginal support.               Pap taken: No. Bimanual Exam:  Uterus:  normal size, contour, position, consistency, mobility, non-tender              Adnexa: no mass, fullness, tenderness              Rectal exam: Yes.  .  Confirms.              Anus:  normal sphincter tone, no lesions  Chaperone was present for exam.  Assessment:   Well woman visit with normal exam. Incomplete uterovaginal prolapse.  Stress incontinence demonstrated with valsalva today on exam.  Elevated LDL cholesterol.  Plan: Mammogram screening discussed. Recommended self breast awareness. Pap and HR HPV as above. Guidelines for Calcium, Vitamin D, regular exercise program including cardiovascular and weight bearing exercise. Discussed LAVH/salpingectomy/anterior repair/TVT/cysto discussed.  She will consider.  I also discussed PT. IFOB to patient.  Return for fasting labs.  She knows that she needs to return to her PCP if her LDL cholesterol is still elevated.   Follow up annually and prn.  After visit summary provided.

## 2017-04-13 DIAGNOSIS — H2513 Age-related nuclear cataract, bilateral: Secondary | ICD-10-CM | POA: Diagnosis not present

## 2017-04-13 DIAGNOSIS — H10413 Chronic giant papillary conjunctivitis, bilateral: Secondary | ICD-10-CM | POA: Diagnosis not present

## 2017-04-13 DIAGNOSIS — H04123 Dry eye syndrome of bilateral lacrimal glands: Secondary | ICD-10-CM | POA: Diagnosis not present

## 2017-04-13 DIAGNOSIS — D4981 Neoplasm of unspecified behavior of retina and choroid: Secondary | ICD-10-CM | POA: Diagnosis not present

## 2017-04-20 ENCOUNTER — Other Ambulatory Visit (INDEPENDENT_AMBULATORY_CARE_PROVIDER_SITE_OTHER): Payer: BLUE CROSS/BLUE SHIELD

## 2017-04-20 DIAGNOSIS — Z0000 Encounter for general adult medical examination without abnormal findings: Secondary | ICD-10-CM

## 2017-04-20 DIAGNOSIS — Z01419 Encounter for gynecological examination (general) (routine) without abnormal findings: Secondary | ICD-10-CM | POA: Diagnosis not present

## 2017-04-20 LAB — FECAL OCCULT BLOOD, IMMUNOCHEMICAL: IFOBT: NEGATIVE

## 2017-04-20 NOTE — Addendum Note (Signed)
Addended by: Zoila Shutter on: 04/20/2017 10:14 AM   Modules accepted: Orders

## 2017-04-21 LAB — COMPREHENSIVE METABOLIC PANEL
ALK PHOS: 58 IU/L (ref 39–117)
ALT: 34 IU/L — AB (ref 0–32)
AST: 28 IU/L (ref 0–40)
Albumin/Globulin Ratio: 2.7 — ABNORMAL HIGH (ref 1.2–2.2)
Albumin: 4.6 g/dL (ref 3.5–5.5)
BILIRUBIN TOTAL: 0.5 mg/dL (ref 0.0–1.2)
BUN/Creatinine Ratio: 26 — ABNORMAL HIGH (ref 9–23)
BUN: 18 mg/dL (ref 6–24)
CHLORIDE: 106 mmol/L (ref 96–106)
CO2: 27 mmol/L (ref 18–29)
Calcium: 9.1 mg/dL (ref 8.7–10.2)
Creatinine, Ser: 0.69 mg/dL (ref 0.57–1.00)
GFR calc Af Amer: 112 mL/min/{1.73_m2} (ref >59)
GFR calc non Af Amer: 97 mL/min/{1.73_m2} (ref >59)
GLOBULIN, TOTAL: 1.7 g/dL (ref 1.5–4.5)
Glucose: 98 mg/dL (ref 65–99)
Potassium: 4.3 mmol/L (ref 3.5–5.2)
SODIUM: 142 mmol/L (ref 134–144)
Total Protein: 6.3 g/dL (ref 6.0–8.5)

## 2017-04-21 LAB — VITAMIN D 25 HYDROXY (VIT D DEFICIENCY, FRACTURES): Vit D, 25-Hydroxy: 25.2 ng/mL — ABNORMAL LOW (ref 30.0–100.0)

## 2017-04-21 LAB — CBC
HEMATOCRIT: 43 % (ref 34.0–46.6)
Hemoglobin: 13.7 g/dL (ref 11.1–15.9)
MCH: 29 pg (ref 26.6–33.0)
MCHC: 31.9 g/dL (ref 31.5–35.7)
MCV: 91 fL (ref 79–97)
PLATELETS: 246 10*3/uL (ref 150–379)
RBC: 4.72 x10E6/uL (ref 3.77–5.28)
RDW: 13.3 % (ref 12.3–15.4)
WBC: 4.2 10*3/uL (ref 3.4–10.8)

## 2017-04-21 LAB — LIPID PANEL
CHOL/HDL RATIO: 5.2 ratio — AB (ref 0.0–4.4)
Cholesterol, Total: 212 mg/dL — ABNORMAL HIGH (ref 100–199)
HDL: 41 mg/dL (ref >39)
LDL CALC: 142 mg/dL — AB (ref 0–99)
TRIGLYCERIDES: 145 mg/dL (ref 0–149)
VLDL Cholesterol Cal: 29 mg/dL (ref 5–40)

## 2017-04-21 LAB — TSH: TSH: 3.29 u[IU]/mL (ref 0.450–4.500)

## 2017-04-22 ENCOUNTER — Encounter: Payer: Self-pay | Admitting: Obstetrics and Gynecology

## 2017-07-09 DIAGNOSIS — M9901 Segmental and somatic dysfunction of cervical region: Secondary | ICD-10-CM | POA: Diagnosis not present

## 2017-07-09 DIAGNOSIS — M47812 Spondylosis without myelopathy or radiculopathy, cervical region: Secondary | ICD-10-CM | POA: Diagnosis not present

## 2017-07-09 DIAGNOSIS — R42 Dizziness and giddiness: Secondary | ICD-10-CM | POA: Diagnosis not present

## 2017-07-09 DIAGNOSIS — M5032 Other cervical disc degeneration, mid-cervical region, unspecified level: Secondary | ICD-10-CM | POA: Diagnosis not present

## 2017-07-12 DIAGNOSIS — M47812 Spondylosis without myelopathy or radiculopathy, cervical region: Secondary | ICD-10-CM | POA: Diagnosis not present

## 2017-07-12 DIAGNOSIS — R42 Dizziness and giddiness: Secondary | ICD-10-CM | POA: Diagnosis not present

## 2017-07-12 DIAGNOSIS — M9901 Segmental and somatic dysfunction of cervical region: Secondary | ICD-10-CM | POA: Diagnosis not present

## 2017-07-12 DIAGNOSIS — M5032 Other cervical disc degeneration, mid-cervical region, unspecified level: Secondary | ICD-10-CM | POA: Diagnosis not present

## 2017-07-17 DIAGNOSIS — R42 Dizziness and giddiness: Secondary | ICD-10-CM | POA: Diagnosis not present

## 2017-07-17 DIAGNOSIS — M5032 Other cervical disc degeneration, mid-cervical region, unspecified level: Secondary | ICD-10-CM | POA: Diagnosis not present

## 2017-07-17 DIAGNOSIS — M9901 Segmental and somatic dysfunction of cervical region: Secondary | ICD-10-CM | POA: Diagnosis not present

## 2017-07-17 DIAGNOSIS — M47812 Spondylosis without myelopathy or radiculopathy, cervical region: Secondary | ICD-10-CM | POA: Diagnosis not present

## 2017-07-20 DIAGNOSIS — R42 Dizziness and giddiness: Secondary | ICD-10-CM | POA: Diagnosis not present

## 2017-07-20 DIAGNOSIS — M5032 Other cervical disc degeneration, mid-cervical region, unspecified level: Secondary | ICD-10-CM | POA: Diagnosis not present

## 2017-07-20 DIAGNOSIS — M47812 Spondylosis without myelopathy or radiculopathy, cervical region: Secondary | ICD-10-CM | POA: Diagnosis not present

## 2017-07-20 DIAGNOSIS — M9901 Segmental and somatic dysfunction of cervical region: Secondary | ICD-10-CM | POA: Diagnosis not present

## 2017-07-26 DIAGNOSIS — M9901 Segmental and somatic dysfunction of cervical region: Secondary | ICD-10-CM | POA: Diagnosis not present

## 2017-07-26 DIAGNOSIS — R42 Dizziness and giddiness: Secondary | ICD-10-CM | POA: Diagnosis not present

## 2017-07-26 DIAGNOSIS — M47812 Spondylosis without myelopathy or radiculopathy, cervical region: Secondary | ICD-10-CM | POA: Diagnosis not present

## 2017-07-26 DIAGNOSIS — M5032 Other cervical disc degeneration, mid-cervical region, unspecified level: Secondary | ICD-10-CM | POA: Diagnosis not present

## 2017-10-16 DIAGNOSIS — H2513 Age-related nuclear cataract, bilateral: Secondary | ICD-10-CM | POA: Diagnosis not present

## 2017-10-16 DIAGNOSIS — D4981 Neoplasm of unspecified behavior of retina and choroid: Secondary | ICD-10-CM | POA: Diagnosis not present

## 2017-10-16 DIAGNOSIS — H04123 Dry eye syndrome of bilateral lacrimal glands: Secondary | ICD-10-CM | POA: Diagnosis not present

## 2017-10-16 DIAGNOSIS — H10413 Chronic giant papillary conjunctivitis, bilateral: Secondary | ICD-10-CM | POA: Diagnosis not present

## 2018-04-10 ENCOUNTER — Ambulatory Visit (INDEPENDENT_AMBULATORY_CARE_PROVIDER_SITE_OTHER): Payer: BLUE CROSS/BLUE SHIELD | Admitting: Obstetrics and Gynecology

## 2018-04-10 ENCOUNTER — Other Ambulatory Visit: Payer: Self-pay

## 2018-04-10 ENCOUNTER — Other Ambulatory Visit (HOSPITAL_COMMUNITY)
Admission: RE | Admit: 2018-04-10 | Discharge: 2018-04-10 | Disposition: A | Discharge status: Home / Self Care | Payer: BLUE CROSS/BLUE SHIELD | Source: Ambulatory Visit | Attending: Obstetrics and Gynecology | Admitting: Obstetrics and Gynecology

## 2018-04-10 ENCOUNTER — Encounter: Payer: Self-pay | Admitting: Obstetrics and Gynecology

## 2018-04-10 ENCOUNTER — Other Ambulatory Visit: Payer: Self-pay | Admitting: Family Medicine

## 2018-04-10 VITALS — BP 124/68 | HR 88 | Resp 16 | Ht 64.25 in | Wt 139.0 lb

## 2018-04-10 DIAGNOSIS — Z1211 Encounter for screening for malignant neoplasm of colon: Secondary | ICD-10-CM | POA: Diagnosis not present

## 2018-04-10 DIAGNOSIS — Z01419 Encounter for gynecological examination (general) (routine) without abnormal findings: Secondary | ICD-10-CM | POA: Insufficient documentation

## 2018-04-10 DIAGNOSIS — N393 Stress incontinence (female) (male): Secondary | ICD-10-CM | POA: Diagnosis not present

## 2018-04-10 DIAGNOSIS — Z1231 Encounter for screening mammogram for malignant neoplasm of breast: Secondary | ICD-10-CM

## 2018-04-10 NOTE — Patient Instructions (Signed)

## 2018-04-10 NOTE — Progress Notes (Signed)
57 y.o. G97P3003 Married Caucasian female here for annual exam.    Has less urgency since taking something herbal, metorinum.  Still has leakage with cough and sneeze and walking.   Mother living with her. She has advanced dementia.  Helping to care for her and feeling tired.  Siblings are helping to share the care plan.   PCP: Colin Benton, MD     Patient's last menstrual period was 11/13/2012 (approximate).           Sexually active: Yes.    The current method of family planning is post menopausal status.    Exercising: No.  The patient does not participate in regular exercise at present. Smoker:  no  Health Maintenance: Pap:   03-29-15 Neg:Neg HR HPV History of abnormal Pap:  no MMG:  06/08/16 BIRADS 1 negative/density c.   Colonoscopy:  never BMD:   n/a  Result  n/a TDaP:  Patient declines Gardasil:   n/a HIV:  Likely in pregnancy and then with insurance physical 7 years ago. Hep C: never? Screening Labs:  Discuss today   reports that she has never smoked. She has never used smokeless tobacco. She reports that she does not drink alcohol or use drugs.  Past Medical History:  Diagnosis Date  . Allergy   . Anxiety   . Depression   . Elevated ALT measurement 2018   34  . Fibromyalgia   . Frequent headaches   . Hyperlipidemia   . Low vitamin D level   . Migraine   . Urinary tract infection   . Urine incontinence     Past Surgical History:  Procedure Laterality Date  . LIPOMA EXCISION  2006   left leg  . TONSILLECTOMY    . WISDOM TOOTH EXTRACTION      Current Outpatient Medications  Medication Sig Dispense Refill  . Chlorpheniramine Maleate (CHLOR-TRIMETON ALLERGY PO) Take by mouth as needed.    . COD LIVER OIL PO Take by mouth.    Marland Kitchen ibuprofen (ADVIL,MOTRIN) 200 MG tablet Take 200 mg by mouth as needed.    . Lubricants (SURGICAL LUBRICANT) gel Apply topically as needed. 60 g 3  . Multiple Vitamins-Minerals (EMERGEN-C IMMUNE PO) Take by mouth.    . Probiotic  Product (PROBIOTIC PO) Take by mouth.     No current facility-administered medications for this visit.     Family History  Problem Relation Age of Onset  . Cancer Father        mesothelioma - worked in Automotive engineer  . Hyperlipidemia Father   . Hypertension Father   . Celiac disease Sister   . Migraines Sister   . Hyperlipidemia Mother   . Alzheimer's disease Mother   . Mental illness Son     Review of Systems  Constitutional:       Weight gain   HENT: Negative.   Eyes: Negative.   Respiratory: Negative.   Cardiovascular: Negative.   Gastrointestinal: Negative.   Endocrine: Negative.   Genitourinary:       Loss of urine spontaneously Loss of urine with sneeze or cough   Musculoskeletal: Negative.   Skin: Negative.   Allergic/Immunologic: Negative.   Neurological: Negative.   Hematological: Negative.   Psychiatric/Behavioral: Negative.     Exam:   BP 124/68 (BP Location: Right Arm, Patient Position: Sitting, Cuff Size: Normal)   Pulse 88   Resp 16   Ht 5' 4.25" (1.632 m)   Wt 139 lb (63 kg)   LMP 11/13/2012 (  Approximate)   BMI 23.67 kg/m     General appearance: alert, cooperative and appears stated age Head: Normocephalic, without obvious abnormality, atraumatic Neck: no adenopathy, supple, symmetrical, trachea midline and thyroid normal to inspection and palpation Lungs: clear to auscultation bilaterally Breasts: normal appearance, no masses or tenderness, No nipple retraction or dimpling, No nipple discharge or bleeding, No axillary or supraclavicular adenopathy Heart: regular rate and rhythm Abdomen: soft, non-tender; no masses, no organomegaly Extremities: extremities normal, atraumatic, no cyanosis or edema Skin: Skin color, texture, turgor normal. No rashes or lesions Lymph nodes: Cervical, supraclavicular, and axillary nodes normal. No abnormal inguinal nodes palpated Neurologic: Grossly normal  Pelvic: External genitalia:  no lesions               Urethra:  normal appearing urethra with no masses, tenderness or lesions              Bartholins and Skenes: normal                 Vagina: normal appearing vagina with normal color and discharge, no lesions              Cervix: no lesions              Pap taken: Yes.   Bimanual Exam:  Uterus:  normal size, contour, position, consistency, mobility, non-tender              Adnexa: no mass, fullness, tenderness              Rectal exam: Yes.  .  Confirms.              Anus:  normal sphincter tone, no lesions  Chaperone was present for exam.  Assessment:   Well woman visit with normal exam. Incomplete uterovaginal prolapse.  Stress incontinence demonstrated with valsalva today on exam.  Elevated LDL cholesterol.  Plan: Mammogram screening.  She will schedule.  Recommended self breast awareness. Pap and HR HPV as above. Guidelines for Calcium, Vitamin D, regular exercise program including cardiovascular and weight bearing exercise. IFOB. Labs with PCP.  Referral to Ileana Roup for pelvic floor therapy.  Follow up annually and prn.       After visit summary provided.

## 2018-04-12 LAB — CYTOLOGY - PAP
DIAGNOSIS: NEGATIVE
HPV (WINDOPATH): NOT DETECTED

## 2018-04-18 DIAGNOSIS — Z1211 Encounter for screening for malignant neoplasm of colon: Secondary | ICD-10-CM | POA: Diagnosis not present

## 2018-04-20 LAB — FECAL OCCULT BLOOD, IMMUNOCHEMICAL: Fecal Occult Bld: NEGATIVE

## 2018-04-20 LAB — SPECIMEN STATUS REPORT

## 2018-04-23 ENCOUNTER — Ambulatory Visit: Payer: BLUE CROSS/BLUE SHIELD

## 2018-05-06 ENCOUNTER — Encounter: Payer: BLUE CROSS/BLUE SHIELD | Admitting: Family Medicine

## 2018-05-28 ENCOUNTER — Encounter: Payer: BLUE CROSS/BLUE SHIELD | Admitting: Family Medicine

## 2018-07-22 NOTE — Progress Notes (Deleted)
HPI:  Using dictation device. Unfortunately this device frequently misinterprets words/phrases.  Here for CPE: Due for flu vaccine, complete mammo (ordered by gyn) Appears declined tdap, hiv and hep c screening  -Concerns and/or follow up today:   Chronic medical problems summarized below were reviewed for changes.***. PMH GAD, fibromyalgia, migraines, seasonal allergies, HLD, Hyperglycemia, obesity and poor compliance with follow up and recommendations. Refused medications in the past because had a friend that she reports almost died from a statin.   -Diet: variety of foods, balance and well rounded, larger portion sizes -Exercise: no regular exercise -Taking folic acid, vitamin D or calcium: no -Diabetes and Dyslipidemia Screening: *** -Vaccines: see vaccine section EPIC -pap history: sees gyn, Dr. Quincy Simmonds - neg 03/2018 -FDLMP: see nursing notes -sexual activity: yes, female partner, no new partners -wants STI testing (Hep C if born [birth year]): no -FH breast, colon or ovarian ca: see FH Last mammogram: sees gyn, Dr. Quincy Simmonds Last colon cancer screening: does annual IFOB with gyn Breast Ca Risk Assessment: see family history and pt history DEXA (>/= 37): ***  -Alcohol, Tobacco, drug use: see social history  Review of Systems - no fevers, unintentional weight loss, vision loss, hearing loss, chest pain, sob, hemoptysis, melena, hematochezia, hematuria, genital discharge, changing or concerning skin lesions, bleeding, bruising, loc, thoughts of self harm or SI  Past Medical History:  Diagnosis Date  . Allergy   . Anxiety   . Depression   . Elevated ALT measurement 2018   32  . Fibromyalgia   . Frequent headaches   . Hyperlipidemia   . Low vitamin D level   . Migraine   . Urinary tract infection   . Urine incontinence     Past Surgical History:  Procedure Laterality Date  . LIPOMA EXCISION  2006   left leg  . TONSILLECTOMY    . WISDOM TOOTH EXTRACTION      Family History   Problem Relation Age of Onset  . Cancer Father        mesothelioma - worked in Automotive engineer  . Hyperlipidemia Father   . Hypertension Father   . Celiac disease Sister   . Migraines Sister   . Hyperlipidemia Mother   . Alzheimer's disease Mother   . Mental illness Son     Social History   Socioeconomic History  . Marital status: Married    Spouse name: Not on file  . Number of children: Not on file  . Years of education: Not on file  . Highest education level: Not on file  Occupational History  . Not on file  Social Needs  . Financial resource strain: Not on file  . Food insecurity:    Worry: Not on file    Inability: Not on file  . Transportation needs:    Medical: Not on file    Non-medical: Not on file  Tobacco Use  . Smoking status: Never Smoker  . Smokeless tobacco: Never Used  Substance and Sexual Activity  . Alcohol use: No    Alcohol/week: 0.0 standard drinks  . Drug use: No  . Sexual activity: Yes    Partners: Male    Birth control/protection: Post-menopausal  Lifestyle  . Physical activity:    Days per week: Not on file    Minutes per session: Not on file  . Stress: Not on file  Relationships  . Social connections:    Talks on phone: Not on file    Gets together: Not on file  Attends religious service: Not on file    Active member of club or organization: Not on file    Attends meetings of clubs or organizations: Not on file    Relationship status: Not on file  Other Topics Concern  . 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  Chlorpheniramine Maleate (CHLOR-TRIMETON ALLERGY PO), Take by mouth as needed., Disp: , Rfl:  .  COD LIVER OIL PO, Take by mouth., Disp: ,  Rfl:  .  ibuprofen (ADVIL,MOTRIN) 200 MG tablet, Take 200 mg by mouth as needed., Disp: , Rfl:  .  Lubricants (SURGICAL LUBRICANT) gel, Apply topically as needed., Disp: 60 g, Rfl: 3 .  Multiple Vitamins-Minerals (EMERGEN-C IMMUNE PO), Take by mouth., Disp: , Rfl:  .  Probiotic Product (PROBIOTIC PO), Take by mouth., Disp: , Rfl:   EXAM:  There were no vitals filed for this visit.  GENERAL: vitals reviewed and listed below, alert, oriented, appears well hydrated and in no acute distress  HEENT: head atraumatic, PERRLA, normal appearance of eyes, ears, nose and mouth. moist mucus membranes.  NECK: supple, no masses or lymphadenopathy  LUNGS: clear to auscultation bilaterally, no rales, rhonchi or wheeze  CV: HRRR, no peripheral edema or cyanosis, normal pedal pulses  ABDOMEN: bowel sounds normal, soft, non tender to palpation, no masses, no rebound or guarding  GU/BREAST: ***  SKIN: no rash or abnormal lesions  MS: normal gait, moves all extremities normally  NEURO: normal gait, speech and thought processing grossly intact, muscle tone grossly intact throughout  PSYCH: normal affect, pleasant and cooperative  ASSESSMENT AND PLAN:  Discussed the following assessment and plan:  PREVENTIVE EXAM: -Discussed and advised all [place] preventive services health task force level A and B recommendations for age, sex and risks. -Advised at least 150 minutes of exercise per week and a healthy diet with avoidance of (less then 1 serving per week) processed foods, white starches, red meat, fast foods and sweets and consisting of: * 5-9 servings of fresh fruits and vegetables (not corn or potatoes) *nuts and seeds, beans *olives and olive oil *lean meats such as fish and white chicken  *whole grains -labs, studies and vaccines per orders this encounter  There are no diagnoses linked to this encounter. ***  Patient advised to return to clinic immediately if symptoms worsen or persist or  new concerns.  There are no Patient Instructions on file for this visit.  No follow-ups on file.  Lucretia Kern, DO

## 2018-07-23 ENCOUNTER — Encounter: Payer: BLUE CROSS/BLUE SHIELD | Admitting: Family Medicine

## 2018-08-12 NOTE — Progress Notes (Signed)
HPI:  Using dictation device. Unfortunately this device frequently misinterprets words/phrases.  Here for CPE: Due for mammo, flu, tetanus, hep c, labs.  -Concerns and/or follow up today: Hx hyperlipidemia, hyperglycemia, seasonal allergies, migraines and poor compliance. Refused medication or recheck in the past. Refuses statin as a friend "almost died" from taking a statin. Has not been here in several years. Reports doing okay for the most part.  Has had a lot of stress, as her mother had a stroke and now is in a facility for dementia.  Not eating as healthy and has not gotten regular exercise over the summer.  She plans to get back on track with this.  She prefers to avoid medications and work on diet and lifestyle.  -Taking folic acid, vitamin D or calcium: no -Diabetes and Dyslipidemia Screening: Fasting for labs -Vaccines: see vaccine section EPIC -pap history: sees Dr. Quincy Simmonds for women's heath, annual in 03/2018 -FDLMP: see nursing notes -sexual activity: yes, female partner, no new partners -wants STI testing (Hep C if born [birth year]): no -FH breast, colon or ovarian ca: see FH Last mammogram: last mammo 2017 in epic, sees gyn -she plans to schedule at [place] Last colon cancer screening: does annual stool cards with Dr. Quincy Simmonds, done 04/2018 Breast Ca Risk Assessment: see family history and pt history DEXA (>/= 65): Not applicable  -Alcohol, Tobacco, drug use: see social history  Review of Systems - no fevers, unintentional weight loss, vision loss, hearing loss, chest pain, sob, hemoptysis, melena, hematochezia, hematuria, genital discharge, changing or concerning skin lesions, bleeding, bruising, loc, thoughts of self harm or SI  Past Medical History:  Diagnosis Date  . Allergy   . Anxiety   . Depression   . Elevated ALT measurement 2018   32  . Fibromyalgia   . Frequent headaches   . Hyperlipidemia   . Low vitamin D level   . Migraine   . Urinary tract  infection   . Urine incontinence     Past Surgical History:  Procedure Laterality Date  . LIPOMA EXCISION  2006   left leg  . TONSILLECTOMY    . WISDOM TOOTH EXTRACTION      Family History  Problem Relation Age of Onset  . Cancer Father        mesothelioma - worked in Automotive engineer  . Hyperlipidemia Father   . Hypertension Father   . Celiac disease Sister   . Migraines Sister   . Hyperlipidemia Mother   . Alzheimer's disease Mother   . Mental illness Son     Social History   Socioeconomic History  . Marital status: Married    Spouse name: Not on file  . Number of children: Not on file  . Years of education: Not on file  . Highest education level: Not on file  Occupational History  . Not on file  Social Needs  . Financial resource strain: Not on file  . Food insecurity:    Worry: Not on file    Inability: Not on file  . Transportation needs:    Medical: Not on file    Non-medical: Not on file  Tobacco Use  . Smoking status: Never Smoker  . Smokeless tobacco: Never Used  Substance and Sexual Activity  . Alcohol use: No    Alcohol/week: 0.0 standard drinks  . Drug use: No  . Sexual activity: Yes    Partners: Male    Birth control/protection: Post-menopausal  Lifestyle  . Physical activity:  Days per week: Not on file    Minutes per session: Not on file  . Stress: Not on file  Relationships  . Social connections:    Talks on phone: Not on file    Gets together: Not on file    Attends religious service: Not on file    Active member of club or organization: Not on file    Attends meetings of clubs or organizations: Not on file    Relationship status: Not on file  Other Topics Concern  . 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  Acetaminophen (TYLENOL PO), Take by mouth as needed., Disp: , Rfl:  .  Chlorpheniramine Maleate (CHLOR-TRIMETON ALLERGY PO), Take by mouth as needed., Disp: , Rfl:  .  Probiotic Product (PROBIOTIC PO), Take by mouth., Disp: , Rfl:   EXAM:  Vitals:   08/13/18 1054 08/13/18 1059  BP: (!) 138/100 118/70  Pulse: 76   Temp: 98.4 F (36.9 C)     GENERAL: vitals reviewed and listed below, alert, oriented, appears well hydrated and in no acute distress  HEENT: head atraumatic, PERRLA, normal appearance of eyes, ears, nose and mouth. moist mucus membranes.  NECK: supple, no masses or lymphadenopathy  LUNGS: clear to auscultation bilaterally, no rales, rhonchi or wheeze  CV: HRRR, no peripheral edema or cyanosis, normal pedal pulses  ABDOMEN: bowel sounds normal, soft, non tender to palpation, no masses, no rebound or guarding  GU/BREAST: Declined, does with GYN  SKIN: no rash or abnormal lesions  MS: normal gait, moves all extremities normally  NEURO: normal gait, speech and thought processing grossly intact, muscle tone grossly intact throughout  PSYCH: normal affect, pleasant and cooperative  ASSESSMENT AND PLAN:  Discussed the following assessment and plan:  PREVENTIVE EXAM: -Discussed and advised all [place] preventive services health task force level A and B recommendations for age, sex and risks. -Advised at least 150 minutes of exercise per week and a healthy diet with avoidance of (less then 1 serving per week) processed foods, white starches, red meat, fast foods and sweets and consisting of: * 5-9 servings of fresh fruits and vegetables (not corn or potatoes) *nuts and seeds, beans *olives and olive oil *lean meats such as fish and white chicken  *whole grains -labs, studies and vaccines per orders this encounter  2. Screening for depression See epic  3. Elevated blood pressure reading Better on recheck,  she plans to monitor and work on lifestyle changes.  She agrees to return if running elevated at home.  4. Hyperglycemia - Hemoglobin A1c  5. Hyperlipidemia, unspecified hyperlipidemia type -refused medications for this, prefers to treat with lifestyle and homeopathic treatment - sees another provider for this - Lipid panel  6. Need for hepatitis C screening test - Hepatitis C antibody   Patient advised to return to clinic immediately if symptoms worsen or persist or new concerns.  Patient Instructions  BEFORE YOU LEAVE: -labs -Wendie Simmer, please order mammogram at GI -follow up:  Yearly for physical  Monitor your blood pressure and follow up if running above 120/70 on average.  Start back to regular exercise and eating a  healthy diet.   Preventive Care 40-64 Years, Female Preventive care refers to lifestyle choices and visits with your health care provider that can promote health and wellness. What does preventive care include?  A yearly physical exam. This is also called an annual well check.  Dental exams once or twice a year.  Routine eye exams. Ask your health care provider how often you should have your eyes checked.  Personal lifestyle choices, including: ? Daily care of your teeth and gums. ? Regular physical activity. ? Eating a healthy diet. ? Avoiding tobacco and drug use. ? Limiting alcohol use. ? Practicing safe sex. ? Taking vitamin and mineral supplements as recommended by your health care provider. What happens during an annual well check? The services and screenings done by your health care provider during your annual well check will depend on your age, overall health, lifestyle risk factors, and family history of disease. Counseling Your health care provider may ask you questions about your:  Alcohol use.  Tobacco use.  Drug use.  Emotional well-being.  Home and relationship well-being.  Sexual activity.  Eating habits.  Work and work  Statistician.  Method of birth control.  Menstrual cycle.  Pregnancy history.  Screening You may have the following tests or measurements:  Height, weight, and BMI.  Blood pressure.  Lipid and cholesterol levels. These may be checked every 5 years, or more frequently if you are over 69 years old.  Skin check.  Lung cancer screening. You may have this screening every year starting at age 70 if you have a 30-pack-year history of smoking and currently smoke or have quit within the past 15 years.  Fecal occult blood test (FOBT) of the stool. You may have this test every year starting at age 56.  Flexible sigmoidoscopy or colonoscopy. You may have a sigmoidoscopy every 5 years or a colonoscopy every 10 years starting at age 17.  Hepatitis C blood test.  Hepatitis B blood test.  Sexually transmitted disease (STD) testing.  Diabetes screening. This is done by checking your blood sugar (glucose) after you have not eaten for a while (fasting). You may have this done every 1-3 years.  Mammogram. This may be done every 1-2 years. Talk to your health care provider about when you should start having regular mammograms. This may depend on whether you have a family history of breast cancer.  BRCA-related cancer screening. This may be done if you have a family history of breast, ovarian, tubal, or peritoneal cancers.  Pelvic exam and Pap test. This may be done every 3 years starting at age 72. Starting at age 80, this may be done every 5 years if you have a Pap test in combination with an HPV test.  Bone density scan. This is done to screen for osteoporosis. You may have this scan if you are at high risk for osteoporosis.  Discuss your test results, treatment options, and if necessary, the need for more tests with your health care provider. Vaccines Your health care provider may recommend certain vaccines, such as:  Influenza vaccine. This is recommended every year.  Tetanus,  diphtheria, and acellular pertussis (Tdap, Td) vaccine. You may need a Td booster every 10 years.  Varicella vaccine. You may need this if you have not been vaccinated.  Zoster vaccine. You may need this after age 78.  Measles, mumps, and rubella (MMR) vaccine. You may need at least one dose of MMR if you were born in 1957 or later. You  may also need a second dose.  Pneumococcal 13-valent conjugate (PCV13) vaccine. You may need this if you have certain conditions and were not previously vaccinated.  Pneumococcal polysaccharide (PPSV23) vaccine. You may need one or two doses if you smoke cigarettes or if you have certain conditions.  Meningococcal vaccine. You may need this if you have certain conditions.  Hepatitis A vaccine. You may need this if you have certain conditions or if you travel or work in places where you may be exposed to hepatitis A.  Hepatitis B vaccine. You may need this if you have certain conditions or if you travel or work in places where you may be exposed to hepatitis B.  Haemophilus influenzae type b (Hib) vaccine. You may need this if you have certain conditions.  Talk to your health care provider about which screenings and vaccines you need and how often you need them. This information is not intended to replace advice given to you by your health care provider. Make sure you discuss any questions you have with your health care provider. Document Released: 11/26/2015 Document Revised: 07/19/2016 Document Reviewed: 08/31/2015 Elsevier Interactive Patient Education  2018 [employer].     No follow-ups on file.  Lucretia Kern, DO

## 2018-08-13 ENCOUNTER — Encounter: Payer: Self-pay | Admitting: Family Medicine

## 2018-08-13 ENCOUNTER — Ambulatory Visit (INDEPENDENT_AMBULATORY_CARE_PROVIDER_SITE_OTHER): Payer: BLUE CROSS/BLUE SHIELD | Admitting: Family Medicine

## 2018-08-13 VITALS — BP 118/70 | HR 76 | Temp 98.4°F | Ht 63.5 in | Wt 139.6 lb

## 2018-08-13 DIAGNOSIS — Z0000 Encounter for general adult medical examination without abnormal findings: Secondary | ICD-10-CM | POA: Diagnosis not present

## 2018-08-13 DIAGNOSIS — Z1331 Encounter for screening for depression: Secondary | ICD-10-CM

## 2018-08-13 DIAGNOSIS — R739 Hyperglycemia, unspecified: Secondary | ICD-10-CM

## 2018-08-13 DIAGNOSIS — R030 Elevated blood-pressure reading, without diagnosis of hypertension: Secondary | ICD-10-CM

## 2018-08-13 DIAGNOSIS — E785 Hyperlipidemia, unspecified: Secondary | ICD-10-CM | POA: Diagnosis not present

## 2018-08-13 DIAGNOSIS — Z1159 Encounter for screening for other viral diseases: Secondary | ICD-10-CM

## 2018-08-13 LAB — LIPID PANEL
Cholesterol: 228 mg/dL — ABNORMAL HIGH (ref 0–200)
HDL: 44.3 mg/dL (ref >39.00)
LDL Cholesterol: 150 mg/dL — ABNORMAL HIGH (ref 0–99)
NonHDL: 183.79
Total CHOL/HDL Ratio: 5
Triglycerides: 167 mg/dL — ABNORMAL HIGH (ref 0.0–149.0)
VLDL: 33.4 mg/dL (ref 0.0–40.0)

## 2018-08-13 LAB — HEMOGLOBIN A1C: Hgb A1c MFr Bld: 5.5 % (ref 4.6–6.5)

## 2018-08-13 NOTE — Patient Instructions (Signed)
BEFORE YOU LEAVE: -labs -Wendie Simmer, please order mammogram at GI -follow up:  Yearly for physical  Monitor your blood pressure and follow up if running above 120/70 on average.  Start back to regular exercise and eating a healthy diet.   Preventive Care 40-64 Years, Female Preventive care refers to lifestyle choices and visits with your health care provider that can promote health and wellness. What does preventive care include?  A yearly physical exam. This is also called an annual well check.  Dental exams once or twice a year.  Routine eye exams. Ask your health care provider how often you should have your eyes checked.  Personal lifestyle choices, including: ? Daily care of your teeth and gums. ? Regular physical activity. ? Eating a healthy diet. ? Avoiding tobacco and drug use. ? Limiting alcohol use. ? Practicing safe sex. ? Taking vitamin and mineral supplements as recommended by your health care provider. What happens during an annual well check? The services and screenings done by your health care provider during your annual well check will depend on your age, overall health, lifestyle risk factors, and family history of disease. Counseling Your health care provider may ask you questions about your:  Alcohol use.  Tobacco use.  Drug use.  Emotional well-being.  Home and relationship well-being.  Sexual activity.  Eating habits.  Work and work Statistician.  Method of birth control.  Menstrual cycle.  Pregnancy history.  Screening You may have the following tests or measurements:  Height, weight, and BMI.  Blood pressure.  Lipid and cholesterol levels. These may be checked every 5 years, or more frequently if you are over 46 years old.  Skin check.  Lung cancer screening. You may have this screening every year starting at age 61 if you have a 30-pack-year history of smoking and currently smoke or have quit within the past 15 years.  Fecal  occult blood test (FOBT) of the stool. You may have this test every year starting at age 26.  Flexible sigmoidoscopy or colonoscopy. You may have a sigmoidoscopy every 5 years or a colonoscopy every 10 years starting at age 27.  Hepatitis C blood test.  Hepatitis B blood test.  Sexually transmitted disease (STD) testing.  Diabetes screening. This is done by checking your blood sugar (glucose) after you have not eaten for a while (fasting). You may have this done every 1-3 years.  Mammogram. This may be done every 1-2 years. Talk to your health care provider about when you should start having regular mammograms. This may depend on whether you have a family history of breast cancer.  BRCA-related cancer screening. This may be done if you have a family history of breast, ovarian, tubal, or peritoneal cancers.  Pelvic exam and Pap test. This may be done every 3 years starting at age 74. Starting at age 26, this may be done every 5 years if you have a Pap test in combination with an HPV test.  Bone density scan. This is done to screen for osteoporosis. You may have this scan if you are at high risk for osteoporosis.  Discuss your test results, treatment options, and if necessary, the need for more tests with your health care provider. Vaccines Your health care provider may recommend certain vaccines, such as:  Influenza vaccine. This is recommended every year.  Tetanus, diphtheria, and acellular pertussis (Tdap, Td) vaccine. You may need a Td booster every 10 years.  Varicella vaccine. You may need this if you  have not been vaccinated.  Zoster vaccine. You may need this after age 45.  Measles, mumps, and rubella (MMR) vaccine. You may need at least one dose of MMR if you were born in 1957 or later. You may also need a second dose.  Pneumococcal 13-valent conjugate (PCV13) vaccine. You may need this if you have certain conditions and were not previously vaccinated.  Pneumococcal  polysaccharide (PPSV23) vaccine. You may need one or two doses if you smoke cigarettes or if you have certain conditions.  Meningococcal vaccine. You may need this if you have certain conditions.  Hepatitis A vaccine. You may need this if you have certain conditions or if you travel or work in places where you may be exposed to hepatitis A.  Hepatitis B vaccine. You may need this if you have certain conditions or if you travel or work in places where you may be exposed to hepatitis B.  Haemophilus influenzae type b (Hib) vaccine. You may need this if you have certain conditions.  Talk to your health care provider about which screenings and vaccines you need and how often you need them. This information is not intended to replace advice given to you by your health care provider. Make sure you discuss any questions you have with your health care provider. Document Released: 11/26/2015 Document Revised: 07/19/2016 Document Reviewed: 08/31/2015 Elsevier Interactive Patient Education  [employer].

## 2018-08-14 LAB — HEPATITIS C ANTIBODY
HEP C AB: NONREACTIVE
SIGNAL TO CUT-OFF: 0.01 (ref <1.00)

## 2018-09-10 ENCOUNTER — Other Ambulatory Visit: Payer: Self-pay | Admitting: Family Medicine

## 2018-09-10 DIAGNOSIS — Z1231 Encounter for screening mammogram for malignant neoplasm of breast: Secondary | ICD-10-CM

## 2018-09-18 ENCOUNTER — Ambulatory Visit
Admission: RE | Admit: 2018-09-18 | Discharge: 2018-09-18 | Disposition: A | Discharge status: Home / Self Care | Payer: BLUE CROSS/BLUE SHIELD | Source: Ambulatory Visit

## 2018-09-18 DIAGNOSIS — Z1231 Encounter for screening mammogram for malignant neoplasm of breast: Secondary | ICD-10-CM | POA: Diagnosis not present

## 2018-09-20 ENCOUNTER — Other Ambulatory Visit: Payer: Self-pay | Admitting: Family Medicine

## 2018-09-20 DIAGNOSIS — R928 Other abnormal and inconclusive findings on diagnostic imaging of breast: Secondary | ICD-10-CM

## 2018-09-25 ENCOUNTER — Ambulatory Visit
Admission: RE | Admit: 2018-09-25 | Discharge: 2018-09-25 | Disposition: A | Discharge status: Home / Self Care | Payer: BLUE CROSS/BLUE SHIELD | Source: Ambulatory Visit | Attending: Family Medicine | Admitting: Family Medicine

## 2018-09-25 ENCOUNTER — Ambulatory Visit: Payer: BLUE CROSS/BLUE SHIELD

## 2018-09-25 DIAGNOSIS — R928 Other abnormal and inconclusive findings on diagnostic imaging of breast: Secondary | ICD-10-CM

## 2018-09-25 DIAGNOSIS — R922 Inconclusive mammogram: Secondary | ICD-10-CM | POA: Diagnosis not present

## 2019-02-12 ENCOUNTER — Telehealth: Payer: Self-pay | Admitting: *Deleted

## 2019-02-12 NOTE — Telephone Encounter (Signed)
Spoke with patient. She stated that she would like to hold off on the [facility] appointment since she has not met her deductible yet. She will call back to schedule when she needs a refill.

## 2019-02-12 NOTE — Telephone Encounter (Signed)
Copied from [place] [id number]. Topic: Appointment Scheduling - Transfer of Care >> Feb 11, 2019  2:23 PM [name] wrote: Pt is requesting to transfer FROM: [facility] Pt is requesting to transfer TO: [place] Reason for requested transfer: Pt called in and left VM on mailbox.  She rec'd the letter and would like to transfer to DR King number  [phone]   Send CRM to patient's current PCP (transferring FROM).

## 2019-02-19 ENCOUNTER — Encounter: Payer: Self-pay | Admitting: Family Medicine

## 2019-04-23 ENCOUNTER — Ambulatory Visit: Payer: BLUE CROSS/BLUE SHIELD | Admitting: Obstetrics and Gynecology

## 2019-04-29 IMAGING — [facility] DIGITAL DIAGNOSTIC UNILATERAL LEFT MAMMOGRAM WITH TOMO AND CAD
2 series · 3 of 6 positions shown · non-contrast
Comparison: Previous exam(s).

CLINICAL DATA: Screening recall for possible distortion seen in the
left breast on the CC view only.

EXAM:
DIGITAL DIAGNOSTIC UNILATERAL LEFT MAMMOGRAM WITH CAD AND TOMO

[L CC synth-2D]
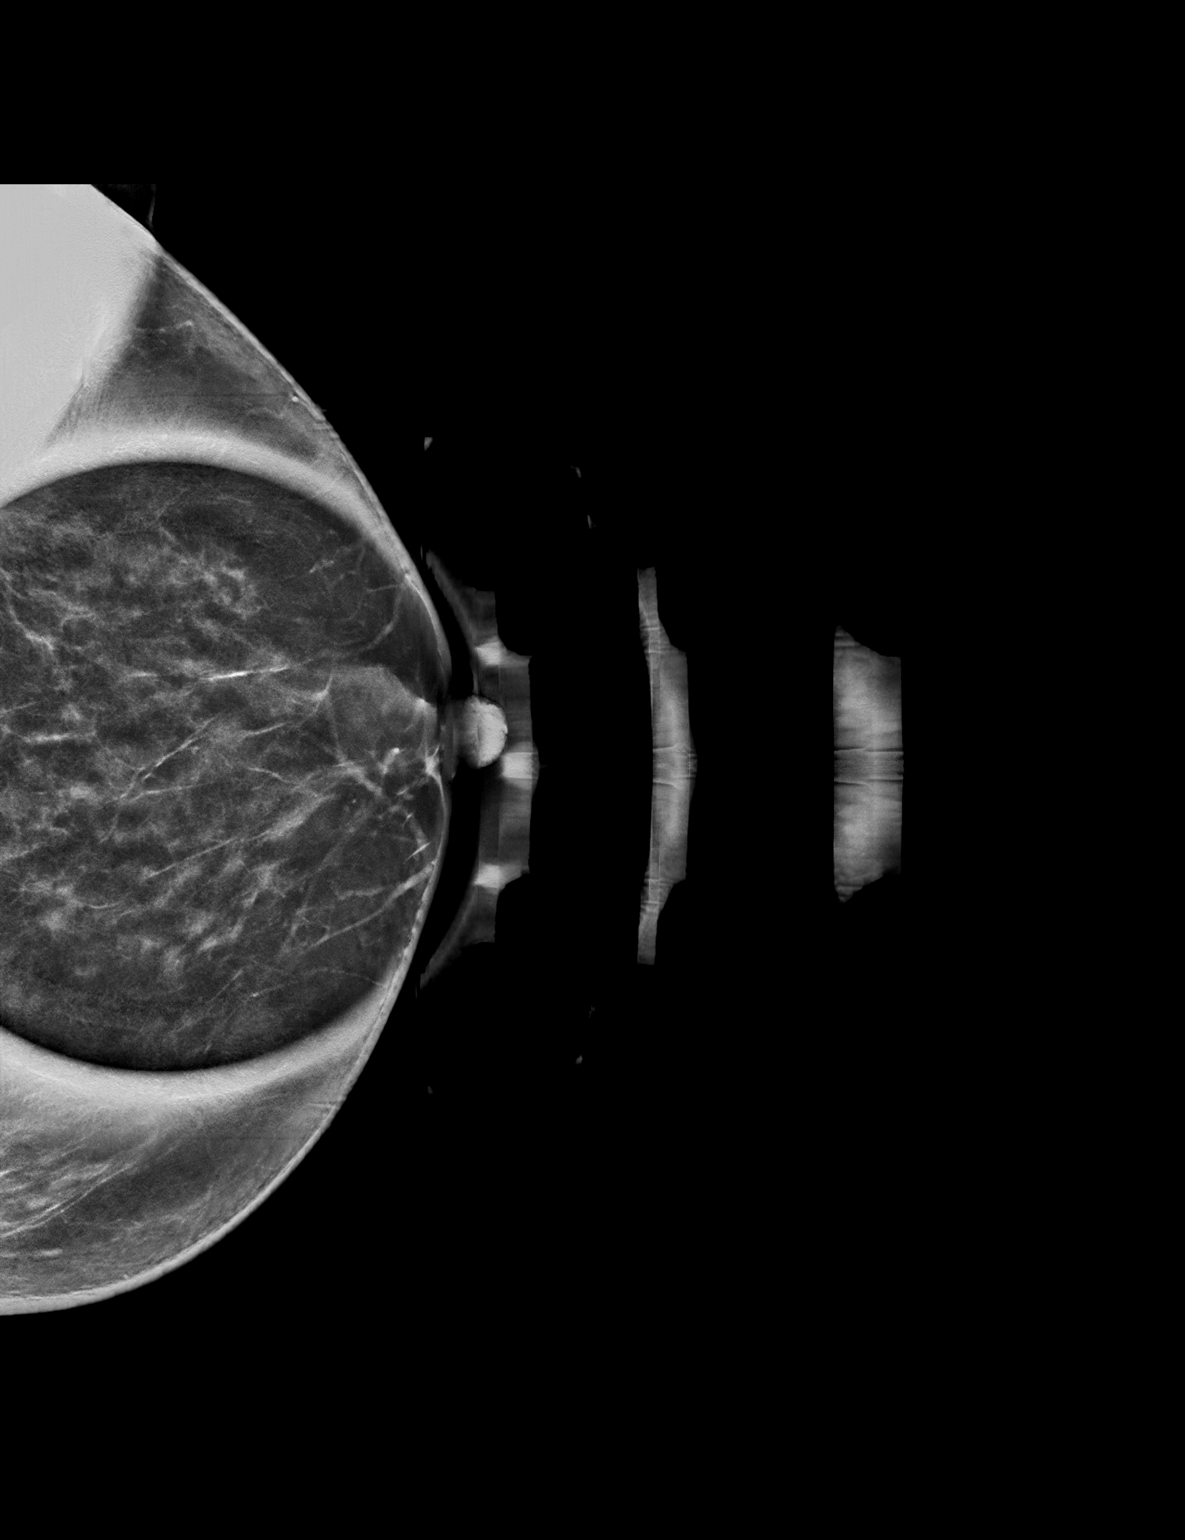

[L CC tomo · 2 of 66 frames shown]
[frame 22/66]
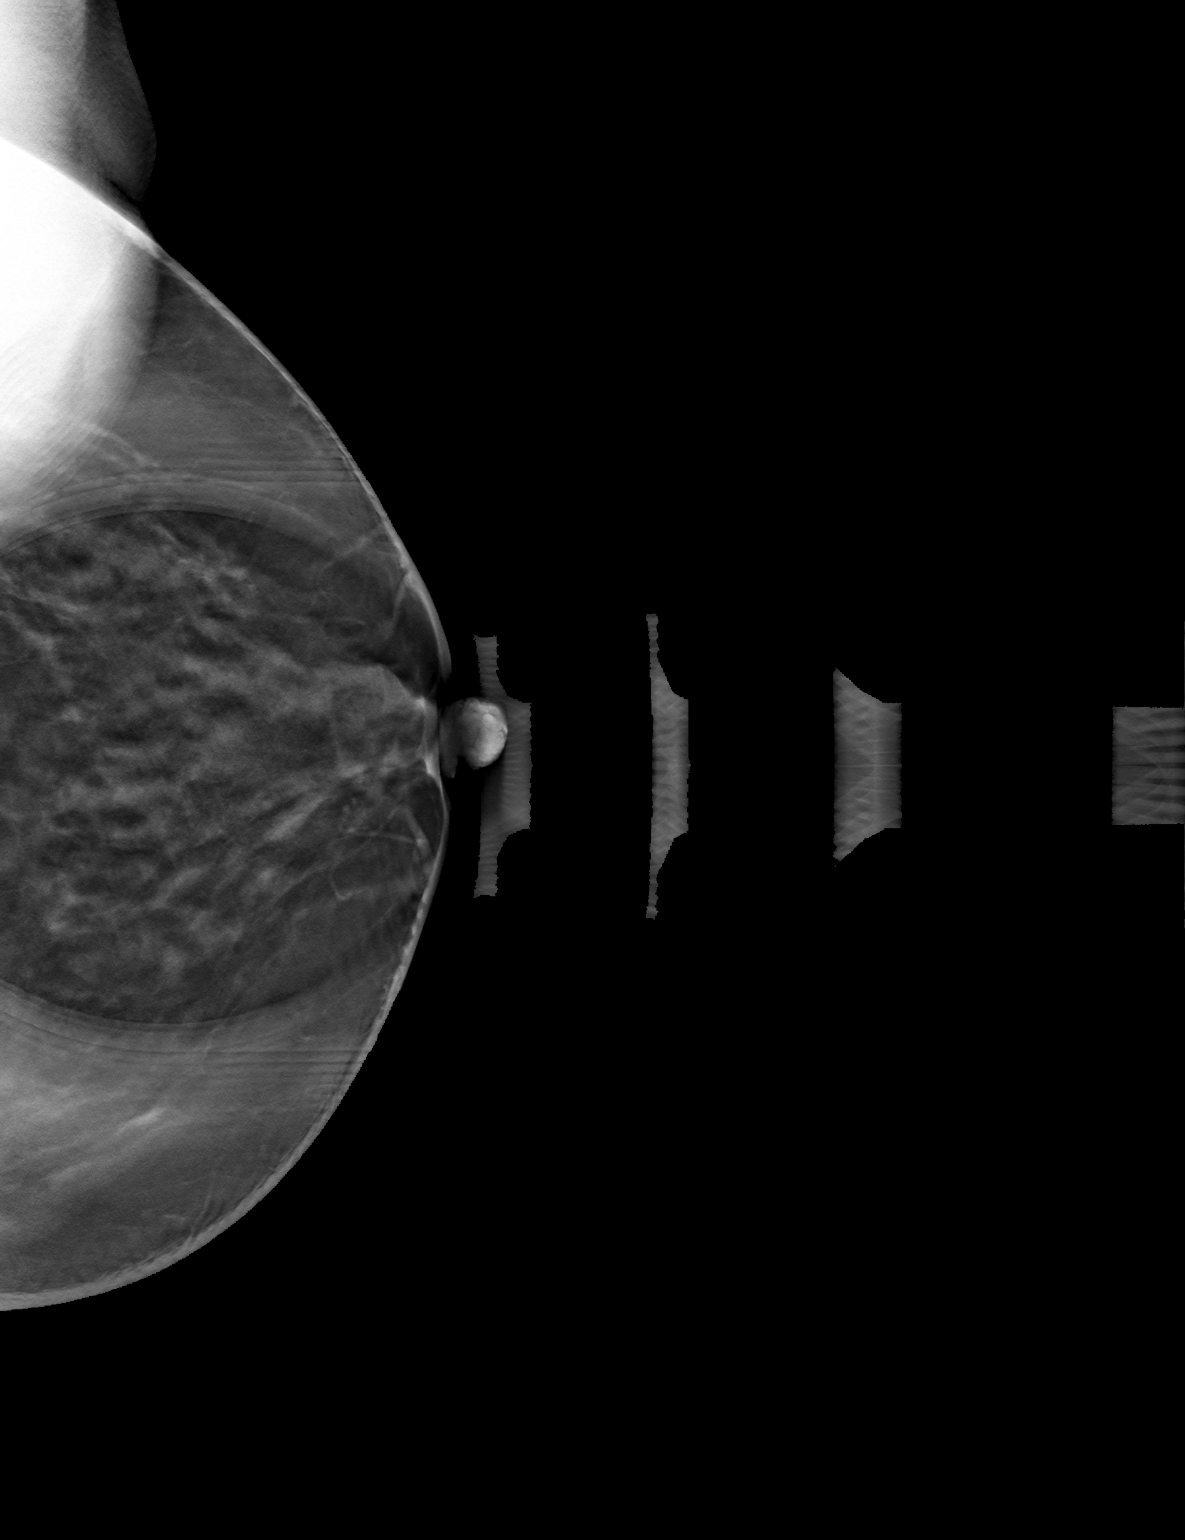
[frame 33/66]
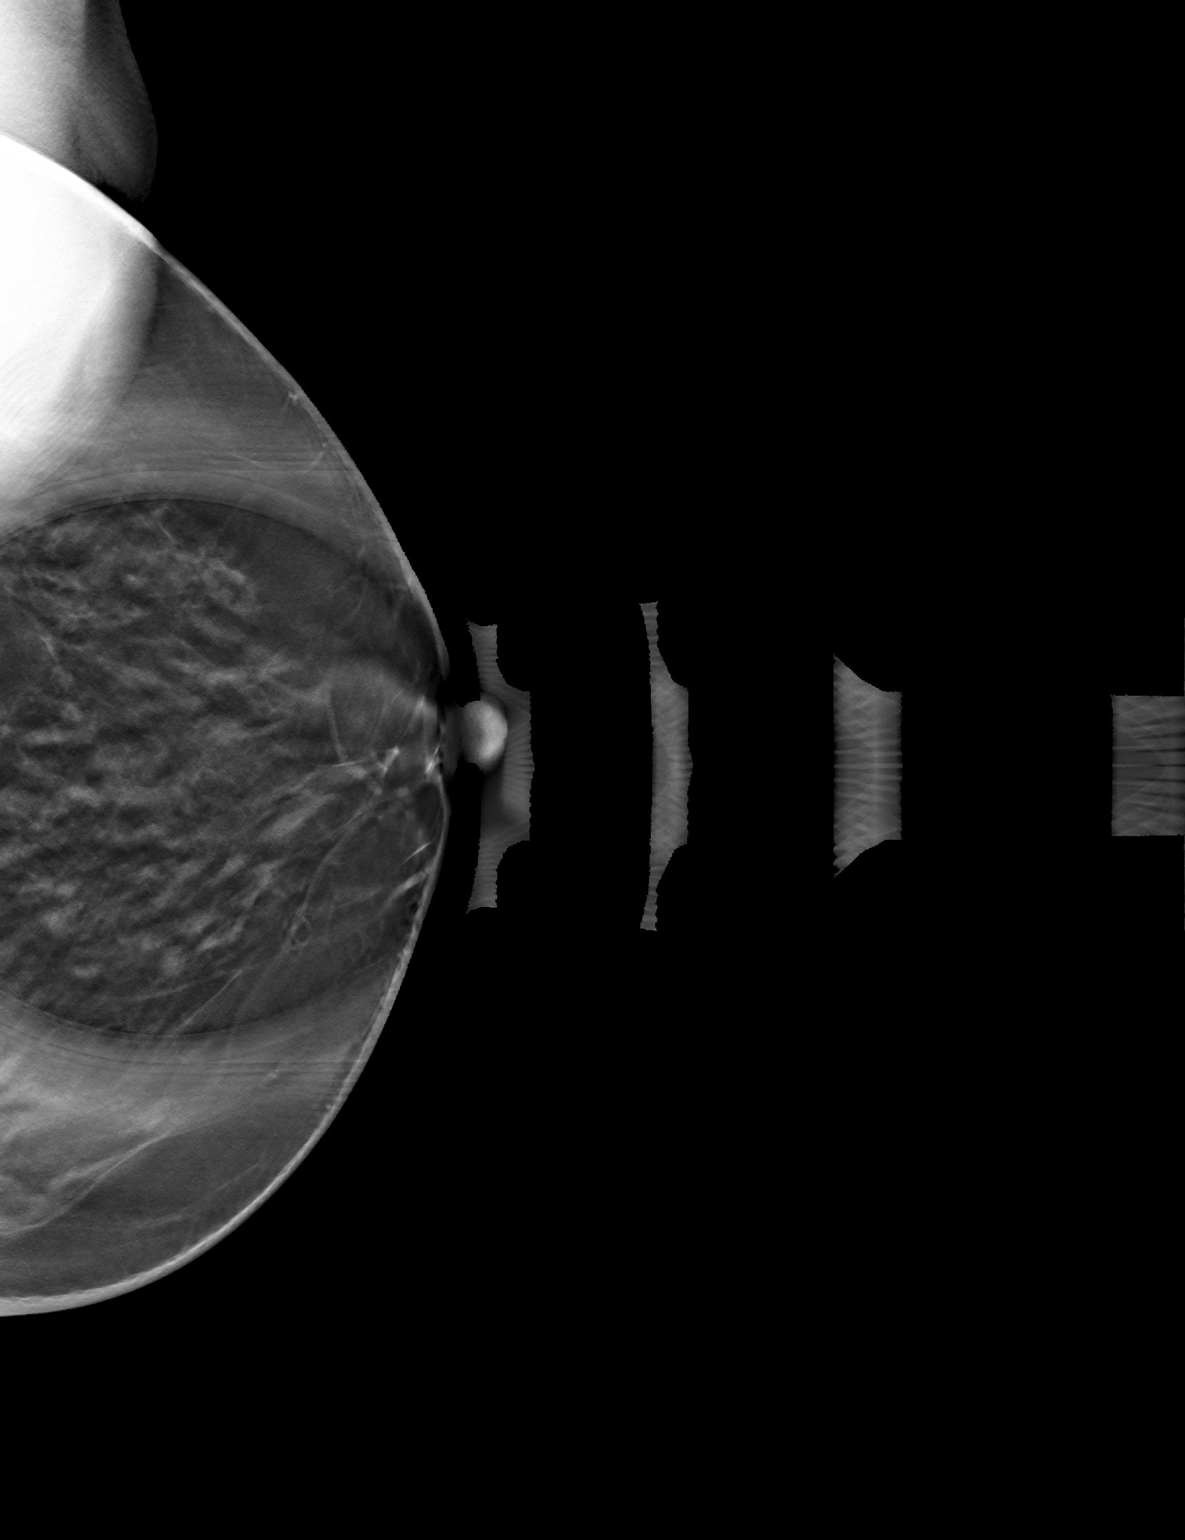

[3 of 6 positions shown; findings below may reference images not displayed]

ACR Breast Density Category c: The breast tissue is heterogeneously
dense, which may obscure small masses.
FINDINGS: Additional tomograms were performed of the left breast. The
initially questioned possible left breast distortion resolves on the
additional imaging with findings compatible with overlapping
fibroglandular tissue. There is no mammographic evidence of
malignancy in the left breast.

Mammographic images were processed with CAD.
IMPRESSION: No mammographic evidence of malignancy in the left breast.

RECOMMENDATION:
Screening mammogram in one year.(Code:P9-0-S1S)

I have discussed the findings and recommendations with the patient.
Results were also provided in writing at the conclusion of the
visit. If applicable, a reminder letter will be sent to the patient
regarding the next appointment.

BI-RADS CATEGORY  1: Negative.

## 2019-06-11 ENCOUNTER — Ambulatory Visit: Payer: BC Managed Care – PPO | Admitting: Obstetrics and Gynecology

## 2019-08-15 ENCOUNTER — Other Ambulatory Visit: Payer: Self-pay

## 2019-08-19 ENCOUNTER — Ambulatory Visit: Payer: BC Managed Care – PPO | Admitting: Obstetrics and Gynecology

## 2019-09-19 ENCOUNTER — Other Ambulatory Visit: Payer: Self-pay

## 2019-09-23 ENCOUNTER — Ambulatory Visit: Payer: BC Managed Care – PPO | Admitting: Obstetrics and Gynecology

## 2019-10-06 NOTE — Progress Notes (Deleted)
58 y.o. G101P3003 Married Caucasian female here for annual exam.    PCP:     Patient's last menstrual period was 11/13/2012 (approximate).           Sexually active: {yes no:314532}  The current method of family planning is post menopausal status.    Exercising: {yes no:314532}  {types:19826} Smoker:  no  Health Maintenance: Pap: 04-10-18 Neg:Neg HR HPV,03-29-15 Neg:Neg HR HPV History of abnormal Pap:  {YES NO:22349} MMG:  09-19-18 3D/Lt.Br.poss.distortion, Rt.Br.Neg/Lt.Br.Diag--Neg/density C/BiRads1 Colonoscopy:  *** BMD:   ***  Result  *** TDaP:  ***Patient declines Gardasil:   n/a [id number] in pregnancy and then with insurance physical 2012 Hep C: 08-13-18 Neg Screening Labs:  Hb today: ***, Urine today: ***   reports that she has never smoked. She has never used smokeless tobacco. She reports that she does not drink alcohol or use drugs.  Past Medical History:  Diagnosis Date  . Allergy   . Anxiety   . Depression   . Elevated ALT measurement 2018   34  . Fibromyalgia   . Frequent headaches   . Hyperlipidemia   . Low vitamin D level   . Migraine   . Urinary tract infection   . Urine incontinence     Past Surgical History:  Procedure Laterality Date  . LIPOMA EXCISION  2006   left leg  . TONSILLECTOMY    . WISDOM TOOTH EXTRACTION      Current Outpatient Medications  Medication Sig Dispense Refill  . Acetaminophen (TYLENOL PO) Take by mouth as needed.    . Chlorpheniramine Maleate (CHLOR-TRIMETON ALLERGY PO) Take by mouth as needed.    . Probiotic Product (PROBIOTIC PO) Take by mouth.     No current facility-administered medications for this visit.     Family History  Problem Relation Age of Onset  . Cancer Father        mesothelioma - worked in Automotive engineer  . Hyperlipidemia Father   . Hypertension Father   . Celiac disease Sister   . Migraines Sister   . Hyperlipidemia Mother   . Alzheimer's disease Mother   . Mental illness Son     Review of  Systems  Exam:   LMP 11/13/2012 (Approximate)     General appearance: alert, cooperative and appears stated age Head: normocephalic, without obvious abnormality, atraumatic Neck: no adenopathy, supple, symmetrical, trachea midline and thyroid normal to inspection and palpation Lungs: clear to auscultation bilaterally Breasts: normal appearance, no masses or tenderness, No nipple retraction or dimpling, No nipple discharge or bleeding, No axillary adenopathy Heart: regular rate and rhythm Abdomen: soft, non-tender; no masses, no organomegaly Extremities: extremities normal, atraumatic, no cyanosis or edema Skin: skin color, texture, turgor normal. No rashes or lesions Lymph nodes: cervical, supraclavicular, and axillary nodes normal. Neurologic: grossly normal  Pelvic: External genitalia:  no lesions              No abnormal inguinal nodes palpated.              Urethra:  normal appearing urethra with no masses, tenderness or lesions              Bartholins and Skenes: normal                 Vagina: normal appearing vagina with normal color and discharge, no lesions              Cervix: no lesions  Pap taken: {yes no:314532} Bimanual Exam:  Uterus:  normal size, contour, position, consistency, mobility, non-tender              Adnexa: no mass, fullness, tenderness              Rectal exam: {yes no:314532}.  Confirms.              Anus:  normal sphincter tone, no lesions  Chaperone was present for exam.  Assessment:   Well woman visit with normal exam.   Plan: Mammogram screening discussed. Self breast awareness reviewed. Pap and HR HPV as above. Guidelines for Calcium, Vitamin D, regular exercise program including cardiovascular and weight bearing exercise.   Follow up annually and prn.   Additional counseling given.  {yes [id number]. _______ minutes face to face time of which over 50% was spent in counseling.    After visit summary provided.

## 2019-10-07 ENCOUNTER — Encounter: Payer: Self-pay | Admitting: Obstetrics and Gynecology

## 2019-10-07 ENCOUNTER — Telehealth: Payer: Self-pay | Admitting: Obstetrics and Gynecology

## 2019-10-07 ENCOUNTER — Ambulatory Visit: Payer: BC Managed Care – PPO | Admitting: Obstetrics and Gynecology

## 2019-10-07 NOTE — Telephone Encounter (Signed)
Thank you for the update!

## 2019-10-07 NOTE — Telephone Encounter (Signed)
Patient called and rescheduled her aex today due to having a migraine.

## 2019-10-20 NOTE — Progress Notes (Signed)
58 y.o. G32P3003 Married Caucasian female here for annual exam.    Still with urinary leakage.  Doing dietary control.  Doing homeopathic care.  She declines physical therapy at this time.  We tried a pessary in the past, and this did not stay in.  Her urinary incontinence became worse.   Daughter married this year.   PCP: None --looking for new PCP   Patient's last menstrual period was 11/13/2012 (approximate).           Sexually active: Yes.    The current method of family planning is post menopausal status.    Exercising: No.  The patient does not participate in regular exercise at present. Smoker:  no  Health Maintenance: Pap: 04-10-18 Neg:Neg HR HPV,03-29-15 Neg:Neg HR HPV History of abnormal Pap:  no MMG:  09-18-18 3D/Lt.Br.poss.distortion,Rt.Br.neg--further evaluation needed/density C--Diag.Lt.Neg/BiRads1/screening 42yr.--patient knows she is due Colonoscopy:  NEVER BMD:   n/a  Result  n/a TDaP:  patient declines Gardasil:   n/a HIV: likely negative in preg.and with insurance physical 2012. Hep C: 08-13-18 Neg Screening Labs:  PCP. Flu vaccine: discussed.    reports that she has never smoked. She has never used smokeless tobacco. She reports that she does not drink alcohol or use drugs.  Past Medical History:  Diagnosis Date  . Allergy   . Anxiety   . Depression   . Elevated ALT measurement 2018   34  . Fibromyalgia   . Frequent headaches   . Hyperlipidemia   . Low vitamin D level   . Migraine   . Urinary tract infection   . Urine incontinence     Past Surgical History:  Procedure Laterality Date  . LIPOMA EXCISION  2006   left leg  . TONSILLECTOMY    . WISDOM TOOTH EXTRACTION      Current Outpatient Medications  Medication Sig Dispense Refill  . Acetaminophen (TYLENOL PO) Take by mouth as needed.    . Chlorpheniramine Maleate (CHLOR-TRIMETON ALLERGY PO) Take by mouth as needed.    . Probiotic Product (PROBIOTIC PO) Take by mouth.     No current  facility-administered medications for this visit.     Family History  Problem Relation Age of Onset  . Cancer Father        mesothelioma - worked in Automotive engineer  . Hyperlipidemia Father   . Hypertension Father   . Celiac disease Sister   . Migraines Sister   . Hyperlipidemia Mother   . Alzheimer's disease Mother   . Mental illness Son     Review of Systems  All other systems reviewed and are negative.   Exam:   BP 128/82   Pulse 66   Temp (!) 96.1 F (35.6 C) (Temporal)   Resp 14   Ht 5\' 4"  (1.626 m)   Wt 132 lb 6.4 oz (60.1 kg)   LMP 11/13/2012 (Approximate)   BMI 22.73 kg/m     General appearance: alert, cooperative and appears stated age Head: normocephalic, without obvious abnormality, atraumatic Neck: no adenopathy, supple, symmetrical, trachea midline and thyroid normal to inspection and palpation Lungs: clear to auscultation bilaterally Breasts: normal appearance, no masses or tenderness, No nipple retraction or dimpling, No nipple discharge or bleeding, No axillary adenopathy Heart: regular rate and rhythm Abdomen: soft, non-tender; no masses, no organomegaly Extremities: extremities normal, atraumatic, no cyanosis or edema Skin: skin color, texture, turgor normal. No rashes or lesions Lymph nodes: cervical, supraclavicular, and axillary nodes normal. Neurologic: grossly normal  Pelvic: External  genitalia:  no lesions              No abnormal inguinal nodes palpated.              Urethra:  normal appearing urethra with no masses, tenderness or lesions              Bartholins and Skenes: normal                 Vagina: normal appearing vagina with normal color and discharge, no lesions              Cervix: no lesions              Pap taken: No. Bimanual Exam:  Uterus:  normal size, contour, position, consistency, mobility, non-tender              Adnexa: no mass, fullness, tenderness              Rectal exam: Yes.  .  Confirms.              Anus:  normal  sphincter tone, no lesions  Chaperone was present for exam.  Assessment:   Well woman visit with normal exam. Incomplete uterovaginal prolapse.  Stress incontinence. Hyperlipidemia.  Plan: Mammogram screening discussed. Self breast awareness reviewed. Pap and HR HPV 2024.  New guideline discussed. Guidelines for Calcium, Vitamin D, regular exercise program including cardiovascular and weight bearing exercise. List of PCPs to patient.  She will do her labs with new PCP.  IFOB. Follow up annually and prn.   After visit summary provided.

## 2019-10-21 ENCOUNTER — Ambulatory Visit (INDEPENDENT_AMBULATORY_CARE_PROVIDER_SITE_OTHER): Payer: BC Managed Care – PPO | Admitting: Obstetrics and Gynecology

## 2019-10-21 ENCOUNTER — Encounter: Payer: Self-pay | Admitting: Obstetrics and Gynecology

## 2019-10-21 ENCOUNTER — Other Ambulatory Visit: Payer: Self-pay

## 2019-10-21 VITALS — BP 128/82 | HR 66 | Temp 96.1°F | Resp 14 | Ht 64.0 in | Wt 132.4 lb

## 2019-10-21 DIAGNOSIS — Z01419 Encounter for gynecological examination (general) (routine) without abnormal findings: Secondary | ICD-10-CM | POA: Diagnosis not present

## 2019-10-21 DIAGNOSIS — Z1211 Encounter for screening for malignant neoplasm of colon: Secondary | ICD-10-CM

## 2019-10-21 NOTE — Patient Instructions (Signed)

## 2019-11-10 DIAGNOSIS — Z1211 Encounter for screening for malignant neoplasm of colon: Secondary | ICD-10-CM | POA: Diagnosis not present

## 2019-11-11 LAB — FECAL OCCULT BLOOD, IMMUNOCHEMICAL: Fecal Occult Bld: NEGATIVE

## 2019-11-27 ENCOUNTER — Ambulatory Visit: Payer: BC Managed Care – PPO | Admitting: Obstetrics and Gynecology

## 2020-02-25 ENCOUNTER — Encounter: Payer: Self-pay | Admitting: Family Medicine

## 2020-02-25 ENCOUNTER — Ambulatory Visit (INDEPENDENT_AMBULATORY_CARE_PROVIDER_SITE_OTHER): Payer: BC Managed Care – PPO | Admitting: Family Medicine

## 2020-02-25 ENCOUNTER — Other Ambulatory Visit: Payer: Self-pay

## 2020-02-25 VITALS — BP 128/76 | HR 68 | Temp 98.1°F | Resp 18 | Ht 63.5 in | Wt 132.5 lb

## 2020-02-25 DIAGNOSIS — R32 Unspecified urinary incontinence: Secondary | ICD-10-CM

## 2020-02-25 DIAGNOSIS — R7989 Other specified abnormal findings of blood chemistry: Secondary | ICD-10-CM | POA: Insufficient documentation

## 2020-02-25 DIAGNOSIS — M1990 Unspecified osteoarthritis, unspecified site: Secondary | ICD-10-CM | POA: Insufficient documentation

## 2020-02-25 DIAGNOSIS — R945 Abnormal results of liver function studies: Secondary | ICD-10-CM | POA: Insufficient documentation

## 2020-02-25 DIAGNOSIS — E785 Hyperlipidemia, unspecified: Secondary | ICD-10-CM | POA: Insufficient documentation

## 2020-02-25 DIAGNOSIS — D3613 Benign neoplasm of peripheral nerves and autonomic nervous system of lower limb, including hip: Secondary | ICD-10-CM | POA: Insufficient documentation

## 2020-02-25 DIAGNOSIS — E782 Mixed hyperlipidemia: Secondary | ICD-10-CM

## 2020-02-25 LAB — COMPREHENSIVE METABOLIC PANEL
ALT: 42 U/L — ABNORMAL HIGH (ref 0–35)
AST: 23 U/L (ref 0–37)
Albumin: 4.5 g/dL (ref 3.5–5.2)
Alkaline Phosphatase: 57 U/L (ref 39–117)
BUN: 24 mg/dL — ABNORMAL HIGH (ref 6–23)
CO2: 29 mEq/L (ref 19–32)
Calcium: 9.3 mg/dL (ref 8.4–10.5)
Chloride: 105 mEq/L (ref 96–112)
Creatinine, Ser: 0.7 mg/dL (ref 0.40–1.20)
GFR: 85.15 mL/min (ref >60.00)
Glucose, Bld: 85 mg/dL (ref 70–99)
Potassium: 3.8 mEq/L (ref 3.5–5.1)
Sodium: 140 mEq/L (ref 135–145)
Total Bilirubin: 0.7 mg/dL (ref 0.2–1.2)
Total Protein: 6.7 g/dL (ref 6.0–8.3)

## 2020-02-25 LAB — LIPID PANEL
Cholesterol: 218 mg/dL — ABNORMAL HIGH (ref 0–200)
HDL: 43.6 mg/dL (ref >39.00)
LDL Cholesterol: 151 mg/dL — ABNORMAL HIGH (ref 0–99)
NonHDL: 174.85
Total CHOL/HDL Ratio: 5
Triglycerides: 119 mg/dL (ref 0.0–149.0)
VLDL: 23.8 mg/dL (ref 0.0–40.0)

## 2020-02-25 LAB — VITAMIN D 25 HYDROXY (VIT D DEFICIENCY, FRACTURES): VITD: 28.56 ng/mL — ABNORMAL LOW (ref 30.00–100.00)

## 2020-02-25 NOTE — Patient Instructions (Addendum)
Skin check - nothing concerning - continue to wear sunblock - continue skin checks  Thumb pain - likely some arthritis - if worsening let me know  Foot - I think this may be a neuroma - can have you podiatry if worsening  Doctor Mariane Masters, PT, DPT, [facility] Physical Therapist/Owner [email or web address]

## 2020-02-25 NOTE — Progress Notes (Signed)
Subjective:     Madison Hart is a 59 y.o. female presenting for transfer of Care (from Dr Maudie Mercury) and Skin Problem (check a mole on her back and overall skin check up)     HPI   #Thumb pain - joint pain x 5 years - worse with pressure  - is able to sew w/o difficulty - lump also present - not sure if arthritis  #Foot bump - at joint of big toe - was the size of a pea and painful - then one day it was gone and it is returning   #modified keto - lost   #Mole on back - mom had a spot on the forehead which was frozen off - precancerous lesion - a little tanning when she was younger - mole on back her whole life - cannot see her back and wanted this check - checks the rest of her body   Review of Systems   Social History   Tobacco Use  Smoking Status Never Smoker  Smokeless Tobacco Never Used        Objective:    BP Readings from Last 3 Encounters:  02/25/20 128/76  10/21/19 128/82  08/13/18 118/70   Wt Readings from Last 3 Encounters:  02/25/20 132 lb 8 oz (60.1 kg)  10/21/19 132 lb 6.4 oz (60.1 kg)  08/13/18 139 lb 9.6 oz (63.3 kg)    BP 128/76   Pulse 68   Temp 98.1 F (36.7 C)   Resp 18   Ht 5' 3.5" (1.613 m)   Wt 132 lb 8 oz (60.1 kg)   LMP 11/13/2012 (Approximate)   BMI 23.10 kg/m    Physical Exam Constitutional:      General: She is not in acute distress.    Appearance: She is well-developed. She is not diaphoretic.  HENT:     Right Ear: External ear normal.     Left Ear: External ear normal.     Nose: Nose normal.  Eyes:     Conjunctiva/sclera: Conjunctivae normal.  Cardiovascular:     Rate and Rhythm: Normal rate.  Pulmonary:     Effort: Pulmonary effort is normal.  Musculoskeletal:     Cervical back: Neck supple.     Comments: Right thumb:  Inspection: normal, no erythema Palpation: slightly larger DIP joint on the medial side ROM: Normal Strength: normal  Left foot Small soft mobile deep lesion over the first MCP  area. Nonpainful. No skin changes  Skin:    General: Skin is warm and dry.     Capillary Refill: Capillary refill takes less than 2 seconds.     Comments: Skin tag on midline lower back. Pink papule on the right temple. Otherwise scattered benign 2-3 mm size moles/freckles.   Neurological:     Mental Status: She is alert. Mental status is at baseline.  Psychiatric:        Mood and Affect: Mood normal.        Behavior: Behavior normal.      The 10-year ASCVD risk score Mikey Bussing DC Jr., et al., 2013) is: 4.2%   Values used to calculate the score:     Age: 34 years     Sex: Female     Is Non-Hispanic African American: No     Diabetic: No     Tobacco smoker: No     Systolic Blood Pressure: 0000000 mmHg     Is BP treated: No     HDL Cholesterol: 44.3 mg/dL  Total Cholesterol: 228 mg/dL      Assessment & Plan:   Problem List Items Addressed This Visit      Musculoskeletal and Integument   RESOLVED: Arthritis     Other   Urinary incontinence - Primary    Has seen GYN. Has never done pelvic floor Pt due to insurance. Information for out-of-pocket PT provided, but she will also check insurance and let me know if she would like a referral.       Hyperlipidemia    ASCVD 4%. Repeat given keto diet - though reports she is limiting fat intake.       Relevant Orders   Lipid panel   Low vitamin D level    Supplements worsening fibromyalgia. Tries to get sun exposure and through diet, but low in the past.       Relevant Orders   Vitamin D, 25-hydroxy   Abnormal LFTs    Noted on labs from 2018 will repeat to see if this has resolved.       Relevant Orders   Comprehensive metabolic panel   RESOLVED: Neuroma of foot     Suspect the thumb has some mild arthritis. Recommended watch and wait.   Foot with possible neuroma given hx. Recommended watch and wait and podiatry if worsening.   Return in about 1 year (around 02/24/2021).  Lesleigh Noe, MD

## 2020-02-25 NOTE — Assessment & Plan Note (Signed)
Has seen GYN. Has never done pelvic floor Pt due to insurance. Information for out-of-pocket PT provided, but she will also check insurance and let me know if she would like a referral.

## 2020-02-25 NOTE — Assessment & Plan Note (Signed)
Noted on labs from 2018 will repeat to see if this has resolved.

## 2020-02-25 NOTE — Assessment & Plan Note (Signed)
ASCVD 4%. Repeat given keto diet - though reports she is limiting fat intake.

## 2020-02-25 NOTE — Assessment & Plan Note (Signed)
Supplements worsening fibromyalgia. Tries to get sun exposure and through diet, but low in the past.

## 2020-10-21 ENCOUNTER — Ambulatory Visit: Payer: BC Managed Care – PPO | Admitting: Obstetrics and Gynecology

## 2021-05-30 ENCOUNTER — Encounter: Payer: Self-pay | Admitting: Obstetrics and Gynecology

## 2021-05-30 ENCOUNTER — Ambulatory Visit (INDEPENDENT_AMBULATORY_CARE_PROVIDER_SITE_OTHER): Payer: Managed Care, Other (non HMO) | Admitting: Obstetrics and Gynecology

## 2021-05-30 ENCOUNTER — Other Ambulatory Visit: Payer: Self-pay

## 2021-05-30 VITALS — BP 128/70 | HR 80 | Temp 99.0°F | Ht 64.0 in | Wt 140.0 lb

## 2021-05-30 DIAGNOSIS — N760 Acute vaginitis: Secondary | ICD-10-CM

## 2021-05-30 DIAGNOSIS — R3989 Other symptoms and signs involving the genitourinary system: Secondary | ICD-10-CM | POA: Diagnosis not present

## 2021-05-30 LAB — WET PREP FOR TRICH, YEAST, CLUE

## 2021-05-30 NOTE — Progress Notes (Signed)
[facility]  VISIT   HPI: 60 y.o.   Married  Caucasian  female   [phone] with Patient's last menstrual period was 11/13/2012 (approximate).   here for vaginal burning and "achiness around urethra". Had televisit with Nurse/doctor through her [email or web address] on 7/14 because she felt she had UTI. She was prescribed Bactrim. Taken it for 4 days and has one more day. Symptoms are not improving. No blood in her urine.   One month ago, had increased sexual activity.  Following this she had bladder pain and burning with urination.  Treated with a homeopathic remedy, which did not work.   She is taking D-Mannose twice a day.   She did 2 home tests showing leukocytes.   Having burning on her skin and itching in the vaginal area.  No itching.  No odor.   No partner change.  No vaginal bleeding.  Some vaginal dryness.  Using lubricant.   She is using incontinence panties.  No new exposures.   Temp: 99.0  Cares for her mother who has dementia and is in Hospice care at home.  GYNECOLOGIC HISTORY: Patient's last menstrual period was 11/13/2012 (approximate). Contraception:  PMP Menopausal hormone therapy:  none Last mammogram: 09-18-18 Lt.Br.distortion,Rt.Br.neg. Lt.Br.Diag/Neg/BiRads1 Last pap smear:  04-10-18 Neg:Neg HR HPV, 03-29-15 Neg:Neg HR HPV         OB History     Gravida  3   Para  3   Term  3   Preterm      AB      Living  3      SAB      IAB      Ectopic      Multiple      Live Births                 Patient Active Problem List   Diagnosis Date Noted   Hyperlipidemia 02/25/2020   Low vitamin D level 02/25/2020   Abnormal LFTs 02/25/2020   Urinary incontinence 09/22/2014   GAD (generalized anxiety disorder) 09/22/2014   Panic disorder 09/22/2014   Fibromyalgia 09/22/2014    Past Medical History:  Diagnosis Date   Allergy    Anxiety    Depression    Elevated ALT measurement 2018   34   Fibromyalgia    Frequent headaches     Hyperlipidemia    Low vitamin D level    Migraine    Urinary tract infection    Urine incontinence     Past Surgical History:  Procedure Laterality Date   LIPOMA EXCISION  2006   left leg   TONSILLECTOMY     WISDOM TOOTH EXTRACTION      Current Outpatient Medications  Medication Sig Dispense Refill   Acetaminophen (TYLENOL PO) Take by mouth as needed.     Probiotic Product (PROBIOTIC PO) Take by mouth.     sulfamethoxazole-trimethoprim (BACTRIM DS) 800-160 MG tablet SMARTSIG:1 Tablet(s) By Mouth Every 12 Hours     No current facility-administered medications for this visit.     ALLERGIES: Claritin [loratadine], Detrol [tolterodine], Erythromycin, and Penicillins  Family History  Problem Relation Age of Onset   Cancer Father        mesothelioma - worked in Automotive engineer   Hyperlipidemia Father    Hypertension Father    Celiac disease Sister    Migraines Sister    Hyperlipidemia Mother    Alzheimer's disease Mother    Anxiety disorder Son    Stroke Maternal Grandmother 7  Alzheimer's disease Paternal Grandmother     Social History   Socioeconomic History   Marital status: Married    Spouse name: Mitzi Hansen   Number of children: 3   Years of education: Not on file   Highest education level: Not on file  Occupational History   Not on file  Tobacco Use   Smoking status: Never   Smokeless tobacco: Never  Vaping Use   Vaping Use: Never used  Substance and Sexual Activity   Alcohol use: No    Alcohol/week: 0.0 standard drinks   Drug use: No   Sexual activity: Yes    Partners: Male    Birth control/protection: Post-menopausal  Other Topics Concern   Not on file  Social History Narrative   02/25/20   From: [place] originally, moved here 2016   Living: with husband Mitzi Hansen (1982), her mother is on hospice and lives with her   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      Family: 3 children - Designer, television/film set (nearby), son in [place], and daughter  in [place] -- no grandchildren yet      Enjoys: disc golf, sew, read, gardening      Exercise: gardening   Diet: modified keto diet       Safety   Seat belts: Yes    Guns: No   Safe in relationships: Yes          Social Determinants of Radio broadcast assistant Strain: Not on file  Food Insecurity: Not on file  Transportation Needs: Not on file  Physical Activity: Not on file  Stress: Not on file  Social Connections: Not on file  Intimate Partner Violence: Not on file    Review of Systems  Genitourinary:  Positive for vaginal pain.       States having urethral pain-achiness  All other systems reviewed and are negative.  PHYSICAL EXAMINATION:    BP 128/70   Pulse 80   Temp 99 F (37.2 C) (Oral)   Ht 5\' 4"  (1.626 m)   Wt 140 lb (63.5 kg)   LMP 11/13/2012 (Approximate)   BMI 24.03 kg/m     General appearance: alert, cooperative and appears stated age   Pelvic: External genitalia:  no lesions              Urethra:  normal appearing urethra with no masses, tenderness or lesions              Bartholins and Skenes: normal                 Vagina: normal appearing vagina with normal color and discharge, no lesions              Cervix: no lesions                Bimanual Exam:  Uterus:  normal size, contour, position, consistency, mobility, non-tender              Adnexa: no mass, fullness, tenderness    Chaperone was present for exam: Estill Bamberg, CMA  ASSESSMENT  Urethral pain.  Aching and not dysuria.  Under treatment for UTI with Bactrim.  Fibromyalgia.  PLAN  Urinalysis:  sg 1.003, Ph 6.0, 0 - 5 WBC, 0 - 2 RBC, 0 - 5 squams, NS bacteria, NS crystals, NS yeast.  UC sent.  Wet prep:  negative yeast, clue cells, and trichomonas.  Will send PCR yeast testing.  Pyridium OTC 100 mg po tid  prn x 2 -3 days.  Declines vaginal estrogen cream.  Will consider pelvic US if testing is negative.    25 total time was spent for this patient encounter, including  preparation, face-to-face counseling with the patient, coordination of care, and documentation of the encounter.

## 2021-06-01 LAB — URINALYSIS, COMPLETE W/RFL CULTURE
Bacteria, UA: NONE SEEN /HPF
Bilirubin Urine: NEGATIVE
Glucose, UA: NEGATIVE
Hyaline Cast: NONE SEEN /LPF
Ketones, ur: NEGATIVE
Leukocyte Esterase: NEGATIVE
Nitrites, Initial: NEGATIVE
Protein, ur: NEGATIVE
Specific Gravity, Urine: 1.003 (ref 1.001–1.035)
pH: 6 (ref 5.0–8.0)

## 2021-06-01 LAB — URINE CULTURE
MICRO NUMBER:: 12130840
SPECIMEN QUALITY:: ADEQUATE

## 2021-06-01 LAB — CULTURE INDICATED

## 2021-06-04 LAB — CANDIDIASIS, PCR
C. albicans, DNA: NOT DETECTED
C. glabrata, DNA: NOT DETECTED
C. parapsilosis, DNA: NOT DETECTED
C. tropicalis, DNA: NOT DETECTED

## 2021-06-17 ENCOUNTER — Other Ambulatory Visit: Payer: Self-pay | Admitting: Obstetrics and Gynecology

## 2021-06-17 ENCOUNTER — Telehealth: Payer: Self-pay | Admitting: *Deleted

## 2021-06-17 DIAGNOSIS — R102 Pelvic and perineal pain: Secondary | ICD-10-CM

## 2021-06-17 DIAGNOSIS — R3989 Other symptoms and signs involving the genitourinary system: Secondary | ICD-10-CM

## 2021-06-17 NOTE — Telephone Encounter (Signed)
Spoke with patient she is aware the scheduling team will reach out and arrange an appointment with her.

## 2021-06-17 NOTE — Progress Notes (Signed)
I have placed an order for a pelvic ultrasound for vaginal pain/urethral pain.

## 2021-06-17 NOTE — Telephone Encounter (Signed)
Patient called with questions pertaining to her last visit 05/30/2021. Patient would like to know if we should schedule and Ultrasound.  Last note indicated we will consider an U/S see below:  PLAN   Urinalysis:  sg 1.003, Ph 6.0, 0 - 5 WBC, 0 - 2 RBC, 0 - 5 squams, NS bacteria, NS crystals, NS yeast. UC sent. Wet prep:  negative yeast, clue cells, and trichomonas. Will send PCR yeast testing. Pyridium OTC 100 mg po tid prn x 2 -3 days. Declines vaginal estrogen cream. Will consider pelvic US if testing is negative.  I will route to Dr. Quincy Simmonds for recommendations.

## 2021-06-17 NOTE — Telephone Encounter (Signed)
I have placed an order for a pelvic ultrasound for vaginal pain/urethral pain.   Please precert and schedule along with a follow up appointment with me.

## 2021-06-28 ENCOUNTER — Other Ambulatory Visit: Payer: Self-pay

## 2021-06-28 ENCOUNTER — Telehealth: Payer: Self-pay | Admitting: Obstetrics and Gynecology

## 2021-06-28 ENCOUNTER — Ambulatory Visit (INDEPENDENT_AMBULATORY_CARE_PROVIDER_SITE_OTHER): Payer: Managed Care, Other (non HMO)

## 2021-06-28 ENCOUNTER — Encounter: Payer: Self-pay | Admitting: Obstetrics and Gynecology

## 2021-06-28 ENCOUNTER — Ambulatory Visit (INDEPENDENT_AMBULATORY_CARE_PROVIDER_SITE_OTHER): Payer: Managed Care, Other (non HMO) | Admitting: Obstetrics and Gynecology

## 2021-06-28 VITALS — BP 144/82 | HR 68 | Ht 64.0 in | Wt 140.0 lb

## 2021-06-28 DIAGNOSIS — R102 Pelvic and perineal pain: Secondary | ICD-10-CM | POA: Diagnosis not present

## 2021-06-28 DIAGNOSIS — R3989 Other symptoms and signs involving the genitourinary system: Secondary | ICD-10-CM | POA: Diagnosis not present

## 2021-06-28 DIAGNOSIS — N393 Stress incontinence (female) (male): Secondary | ICD-10-CM

## 2021-06-28 NOTE — Progress Notes (Signed)
GYNECOLOGY  VISIT   HPI: 60 y.o.   Married  Caucasian  female   [phone] with Patient's last menstrual period was 11/13/2012 (approximate).   here for pelvic ultrasound due to vaginal and urethral pain.   Patient seen for urethral pain and vaginal pain on 05/30/21.  She took Bactrim through outside office and did not have improvement.  Vaginitis testing was negative.   She is doing homeopathic remedy.  Taking creosotom.  Not 100% better but is improved.   Patient was mowing her lawn when the pain started and she is attributing it to a pull or a stain in the pelvic area.  She has fibromyalgia, and she states it takes a while for her to heal following an injury.   Has urinary incontinence.  Would like assistance.   GYNECOLOGIC HISTORY: Patient's last menstrual period was 11/13/2012 (approximate). Contraception:  PMP Menopausal hormone therapy: n/a Last mammogram: 09-18-18 Lt.Br.distortion,Rt.Br.neg. Lt.Br.Diag/Neg/BiRads1 Last pap smear: 04-10-18 Neg:Neg HR HPV, 03-29-15 Neg:Neg HR HPV         OB History     Gravida  3   Para  3   Term  3   Preterm      AB      Living  3      SAB      IAB      Ectopic      Multiple      Live Births                 Patient Active Problem List   Diagnosis Date Noted   Hyperlipidemia 02/25/2020   Low vitamin D level 02/25/2020   Abnormal LFTs 02/25/2020   Urinary incontinence 09/22/2014   GAD (generalized anxiety disorder) 09/22/2014   Panic disorder 09/22/2014   Fibromyalgia 09/22/2014    Past Medical History:  Diagnosis Date   Allergy    Anxiety    Depression    Elevated ALT measurement 2018   34   Fibromyalgia    Frequent headaches    Hyperlipidemia    Low vitamin D level    Migraine    Urinary tract infection    Urine incontinence     Past Surgical History:  Procedure Laterality Date   LIPOMA EXCISION  2006   left leg   TONSILLECTOMY     WISDOM TOOTH EXTRACTION      Current Outpatient Medications   Medication Sig Dispense Refill   Acetaminophen (TYLENOL PO) Take by mouth as needed.     Probiotic Product (PROBIOTIC PO) Take by mouth.     No current facility-administered medications for this visit.     ALLERGIES: Claritin [loratadine], Detrol [tolterodine], Erythromycin, and Penicillins  Family History  Problem Relation Age of Onset   Cancer Father        mesothelioma - worked in Automotive engineer   Hyperlipidemia Father    Hypertension Father    Celiac disease Sister    Migraines Sister    Hyperlipidemia Mother    Alzheimer's disease Mother    Anxiety disorder Son    Stroke Maternal Grandmother 77   Alzheimer's disease Paternal Grandmother     Social History   Socioeconomic History   Marital status: Married    Spouse name: Mitzi Hansen   Number of children: 3   Years of education: Not on file   Highest education level: Not on file  Occupational History   Not on file  Tobacco Use   Smoking status: Never   Smokeless tobacco:  Never  Vaping Use   Vaping Use: Never used  Substance and Sexual Activity   Alcohol use: No    Alcohol/week: 0.0 standard drinks   Drug use: No   Sexual activity: Yes    Partners: Male    Birth control/protection: Post-menopausal  Other Topics Concern   Not on file  Social History Narrative   02/25/20   From: [place] originally, moved here 2016   Living: with husband Mitzi Hansen (1982), her mother is on hospice and lives with her   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      Family: 3 children - Designer, television/film set (nearby), son in [place], and daughter in [place] -- no grandchildren yet      Enjoys: disc golf, sew, read, gardening      Exercise: gardening   Diet: modified keto diet       Safety   Seat belts: Yes    Guns: No   Safe in relationships: Yes          Social Determinants of Radio broadcast assistant Strain: Not on file  Food Insecurity: Not on file  Transportation Needs: Not on file  Physical  Activity: Not on file  Stress: Not on file  Social Connections: Not on file  Intimate Partner Violence: Not on file    Review of Systems  All other systems reviewed and are negative.  PHYSICAL EXAMINATION:    BP (!) 144/82   Pulse 68   Ht '5\' 4"'$  (1.626 m)   Wt 140 lb (63.5 kg)   LMP 11/13/2012 (Approximate)   SpO2 97%   BMI 24.03 kg/m     General appearance: alert, cooperative and appears stated age   Pelvic US  Uterus 7.48 x 5.76 x 3.15 cm.  EMS 1.81 mm.  Ovaries normal.  No adnexal masses. Urethra and bladder normal.  No free fluid.  ASSESSMENT  Vaginal and urethral pain.  Improved.  I suspect some atrophy is playing a role in her symptoms. Stress incontinence.   PLAN  Pelvic US report and images reviewed. We discussed hydration through water based lubricants and cooking oils.  She has declined vaginal estrogens.  Referral to [facility] for pelvic floor therapy.  FU prn.    An After Visit Summary was printed and given to the patient.  21 min total time was spent for this patient encounter, including preparation, face-to-face counseling with the patient, coordination of care, and documentation of the encounter.

## 2021-06-28 NOTE — Telephone Encounter (Signed)
Please make a referral for patient to see Madison Hart with [facility] for pelvic floor therapy for stress urinary incontinence.

## 2021-06-29 NOTE — Telephone Encounter (Signed)
Referral placed at [facility] health outpatient rehab at [place] they will call to schedule.

## 2021-07-04 NOTE — Telephone Encounter (Signed)
Patient scheduled on 07/25/21

## 2021-07-25 ENCOUNTER — Encounter: Payer: Self-pay | Admitting: Physical Therapy

## 2021-07-25 ENCOUNTER — Ambulatory Visit: Payer: Managed Care, Other (non HMO) | Attending: Obstetrics and Gynecology | Admitting: Physical Therapy

## 2021-07-25 ENCOUNTER — Other Ambulatory Visit: Payer: Self-pay

## 2021-07-25 DIAGNOSIS — M6281 Muscle weakness (generalized): Secondary | ICD-10-CM | POA: Insufficient documentation

## 2021-07-25 DIAGNOSIS — R279 Unspecified lack of coordination: Secondary | ICD-10-CM | POA: Insufficient documentation

## 2021-07-25 NOTE — Therapy (Signed)
[facility] [address] [place], [place], [address] Phone: [phone]   Fax:  [phone]  Physical Therapy Evaluation  Patient Details  Name: Madison Hart MRN: [id number] Date of Birth: Jun 26, 1961 Referring Provider (PT): Nunzio Cobbs, MD   Encounter Date: 07/25/2021   PT End of Session - 07/25/21 1507     Visit Number 1    Date for PT Re-Evaluation 10/17/21    PT Start Time 1448    PT Stop Time 1528    PT Time Calculation (min) 40 min    Activity Tolerance Patient tolerated treatment well    Behavior During Therapy [facility] for tasks assessed/performed             Past Medical History:  Diagnosis Date   Allergy    Anxiety    Depression    Elevated ALT measurement 2018   34   Fibromyalgia    Frequent headaches    Hyperlipidemia    Low vitamin D level    Migraine    Urinary tract infection    Urine incontinence     Past Surgical History:  Procedure Laterality Date   LIPOMA EXCISION  2006   left leg   TONSILLECTOMY     WISDOM TOOTH EXTRACTION      There were no vitals filed for this visit.    Subjective Assessment - 07/25/21 1451     Subjective Pt states she has had pain since June and it is in the urethra and low pelvis.  Pt also has some leakage that has been worsening gradually over years.    Currently in Pain? Yes    Pain Score 6    6/10 worst   Pain Location Pelvis    Pain Orientation Lower;Mid    Pain Descriptors / Indicators Burning    Pain Type Acute pain    Pain Onset More than a month ago    Pain Frequency Intermittent    Aggravating Factors  not completely sure    Pain Relieving Factors homeopathy    Multiple Pain Sites No                [facility] PT Assessment - 07/25/21 0001       Assessment   Medical Diagnosis N39.3 (ICD-10-CM) - Stress incontinence    Referring Provider (PT) Nunzio Cobbs, MD    Prior Therapy No      Precautions    Precautions None      Balance Screen   Has the patient fallen in the past 6 months No      [place] residence    Living Arrangements Spouse/significant other;Parent   mom     Prior Function   Level of Independence Independent    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     Pelvic Floor Special Questions - 07/25/21 0001     Prior Pelvic/Prostate Exam Yes    Prior Pregnancies Yes    Number of Pregnancies 3    Number of Vaginal Deliveries 3    Currently Sexually Active Yes    Is this Painful Yes    Marinoff Scale pain prevents any attempts at intercourse    Urinary Leakage Yes    Pad use 2/day    Activities that cause leaking Coughing;Sneezing;Walking;Lifting   all through the day   Urinary urgency Yes    Urinary frequency 1/hour at least    Fecal incontinence No    Falling out feeling (prolapse) Yes    Activities that cause feeling of prolapse when having a BM; walking    Prolapse Anterior Wall   possibly urethra porlapse   Pelvic Floor Internal Exam pt identity confirmed and consent given    Exam Type Vaginal    Palpation elevated tone after contracting    Strength fair squeeze, definite lift    Strength # of reps --   4 quick   Strength # of seconds 2    Tone normal but elevates after contraction                     No emotional/communication  barriers or cognitive limitation. Patient is motivated to learn. Patient understands and agrees with treatment goals and plan. PT explains patient will be examined in standing, sitting, and lying down to see how their muscles and joints work. When they are ready, they will be asked to remove their underwear so PT can examine their perineum. The patient is also given the option of providing their own chaperone as one is not provided in our facility. The patient also has the right and is explained the right to defer or refuse any part of the evaluation or treatment including the internal exam. With the patient's consent, PT will use one gloved finger to gently assess the muscles of the pelvic floor, seeing how well it contracts and relaxes and if there is muscle symmetry. After, the patient will get dressed and PT and patient will discuss exam findings and plan of care. PT and patient discuss plan of care, schedule, attendance policy and HEP activities.        PT Long Term Goals - 07/25/21 1731       PT LONG TERM GOAL #1   Title ind with advanced HEP    Time 12    Period Weeks    Status New    Target Date 10/17/21      PT LONG TERM GOAL #2   Title Pt will report at least 50% less leakage    Time 12    Period Weeks    Status New    Target Date 10/17/21      PT LONG TERM GOAL #3   Title Pt will report 50% less pain with intercourse    Time 12    Period Weeks    Status New    Target Date 10/17/21      PT LONG TERM GOAL #4   Title Pt will be able to demstrate sustained kegel holding for at least 10 seconds to improve endurance activities without leakge    Time 12    Period Weeks    Status New    Target Date 10/17/21  Plan - 07/25/21 1533     Clinical Impression Statement Pt presents to skilled PT due to stress incontinence. Pt has anterior wall prolapse. Pt demonstrates some tension in lumbar region and thoracic erector muscles.  Pt has pelvic floor 3/5  MMT but only holding 2 seconds. She can do 3 quick flicks before becomes fatigued.  Pt will benefit from skilled PT to address all above impairments so that she can return to normal activities without leakage and prevent further prolapse.    Personal Factors and Comorbidities Comorbidity 2    Comorbidities fibro, HA    Examination-Activity Limitations Continence    Examination-Participation Restrictions Yard Work;Community Activity;Interpersonal Relationship    Stability/Clinical Decision Making Stable/Uncomplicated    Clinical Decision Making Low    Rehab Potential Excellent    PT Frequency 1x / week    PT Duration 12 weeks    PT Treatment/Interventions ADLs/Self Care Home Management;Biofeedback;Cryotherapy;Electrical Stimulation;Moist Heat;Therapeutic activities;Therapeutic exercise;Neuromuscular re-education;Patient/family education;Manual techniques;Passive range of motion;Dry needling;Taping    PT Next Visit Plan fascial release lumbar, hamstring stretch; kegel isolation 1 sec hold    PT Home Exercise Plan doing massage with coconut oil    Consulted and Agree with Plan of Care Patient             Patient will benefit from skilled therapeutic intervention in order to improve the following deficits and impairments:  Pain, Postural dysfunction, Increased fascial restricitons, Decreased strength, Decreased coordination, Decreased range of motion, Decreased endurance, Increased muscle spasms  Visit Diagnosis: Muscle weakness (generalized)  Unspecified lack of coordination     Problem List Patient Active Problem List   Diagnosis Date Noted   Hyperlipidemia 02/25/2020   Low vitamin D level 02/25/2020   Abnormal LFTs 02/25/2020   Urinary incontinence 09/22/2014   GAD (generalized anxiety disorder) 09/22/2014   Panic disorder 09/22/2014   Fibromyalgia 09/22/2014    Camillo Flaming Yee Gangi, PT 07/25/2021, 5:34 PM   [facility] [address] [place], [place], [address] Phone: [phone]   Fax:  [phone]  Name: Madison Hart MRN: [id number] Date of Birth: 10-Oct-1961

## 2021-08-01 ENCOUNTER — Other Ambulatory Visit: Payer: Self-pay

## 2021-08-01 ENCOUNTER — Ambulatory Visit: Payer: Managed Care, Other (non HMO) | Admitting: Physical Therapy

## 2021-08-01 DIAGNOSIS — M6281 Muscle weakness (generalized): Secondary | ICD-10-CM

## 2021-08-01 DIAGNOSIS — R279 Unspecified lack of coordination: Secondary | ICD-10-CM

## 2021-08-01 NOTE — Patient Instructions (Signed)
Access Code: [email or web address] URL: [email or web address] Date: 08/01/2021 Prepared by: Jari Favre  Exercises Supine Hamstring Stretch with Strap - 1 x daily - 7 x weekly - 1 sets - 3 reps - 30 sec hold Sidelying Thoracic Lumbar Rotation - 1 x daily - 7 x weekly - 1 sets - 5 reps - 10 sec hold Seated Thoracic Flexion and Rotation with Arms Crossed - 1 x daily - 7 x weekly - 1 sets - 10 reps - 5 sec hold Thoracic Sidebending with Towel Roll - 1 x daily - 7 x weekly - 1 sets - 10 reps - 5 hold Table Lean - 3 x daily - 7 x weekly - 1 sets - 10 reps

## 2021-08-01 NOTE — Therapy (Signed)
[facility] [address] [place], [place], [address] Phone: [phone]   Fax:  [phone]  Physical Therapy Treatment  Patient Details  Name: Madison Hart MRN: [id number] Date of Birth: 1961/08/11 Referring Provider (PT): Nunzio Cobbs, MD   Encounter Date: 08/01/2021   PT End of Session - 08/01/21 1135     Visit Number 2    Date for PT Re-Evaluation 10/17/21    PT Start Time 1107    PT Stop Time 4315    PT Time Calculation (min) 38 min    Activity Tolerance Patient tolerated treatment well    Behavior During Therapy [facility] for tasks assessed/performed             Past Medical History:  Diagnosis Date   Allergy    Anxiety    Depression    Elevated ALT measurement 2018   34   Fibromyalgia    Frequent headaches    Hyperlipidemia    Low vitamin D level    Migraine    Urinary tract infection    Urine incontinence     Past Surgical History:  Procedure Laterality Date   LIPOMA EXCISION  2006   left leg   TONSILLECTOMY     WISDOM TOOTH EXTRACTION      There were no vitals filed for this visit.   Subjective Assessment - 08/01/21 1112     Subjective I finished the block therapy for the pelvic floor.  I think the coconut oil helped.  Pt states the leakage is a little better.    Pertinent History fibromyalgia    Currently in Pain? Yes    Pain Score 4     Pain Location Pelvis    Pain Orientation Mid    Pain Descriptors / Indicators Sore    Pain Type Acute pain    Pain Onset More than a month ago    Pain Frequency Intermittent    Multiple Pain Sites No                               [facility] Adult PT Treatment/Exercise - 08/01/21 0001       Exercises   Exercises Lumbar      Lumbar Exercises: Stretches   Active Hamstring Stretch Right;Left;60 seconds    Double Knee to Chest Stretch 2 reps;60 seconds    Double Knee to Chest Stretch Limitations one in child pose     Other Lumbar Stretch Exercise happy baby - 60 sec      Lumbar Exercises: Standing   Other Standing Lumbar Exercises leaning on table arms straight and elbows bent- 10x with 3 sec rest for kegels      Manual Therapy   Manual Therapy Soft tissue mobilization;Myofascial release    Soft tissue mobilization paraspinals throughout lumbar and thoracic spine    Myofascial Release thoracolumbar MFR;                     PT Education - 08/01/21 1259     Education Details Access Code: [email or web address]    Person(s) Educated Patient    Methods Explanation;Demonstration;Tactile cues;Verbal cues;Handout    Comprehension Verbalized understanding;Returned demonstration                 PT Long Term Goals - 08/01/21 1109       PT LONG TERM GOAL #1   Title ind with advanced  HEP    Status On-going      PT LONG TERM GOAL #2   Title Pt will report at least 50% less leakage    Baseline a little bit better    Status On-going      PT LONG TERM GOAL #3   Title Pt will report 50% less pain with intercourse    Status On-going      PT LONG TERM GOAL #4   Title Pt will be able to demstrate sustained kegel holding for at least 10 seconds to improve endurance activities without leakge    Status On-going                   Plan - 08/01/21 1137     Clinical Impression Statement Pt has tension throughout left side from lumbar to cervical region.  Pt did well with STM and fascial release and given stretches to increased ROM of the spine and muscles.  Pt has already made some progress with less pain and tension.  She will benefit from skilled PT to continue to address impairments for restoring full function.    PT Treatment/Interventions ADLs/Self Care Home Management;Biofeedback;Cryotherapy;Electrical Stimulation;Moist Heat;Therapeutic activities;Therapeutic exercise;Neuromuscular re-education;Patient/family education;Manual techniques;Passive range of motion;Dry needling;Taping    PT  Next Visit Plan discussed adding endurance ex and progression of kegel; maybe release of the paraspinals again throughout spine very tight on left side    PT Home Exercise Plan doing massage with coconut oil;Access Code: [email or web address]    [email or web address] and Agree with Plan of Care Patient             Patient will benefit from skilled therapeutic intervention in order to improve the following deficits and impairments:  Pain, Postural dysfunction, Increased fascial restricitons, Decreased strength, Decreased coordination, Decreased range of motion, Decreased endurance, Increased muscle spasms  Visit Diagnosis: Muscle weakness (generalized)  Unspecified lack of coordination     Problem List Patient Active Problem List   Diagnosis Date Noted   Hyperlipidemia 02/25/2020   Low vitamin D level 02/25/2020   Abnormal LFTs 02/25/2020   Urinary incontinence 09/22/2014   GAD (generalized anxiety disorder) 09/22/2014   Panic disorder 09/22/2014   Fibromyalgia 09/22/2014    Madison Hart, PT 08/01/2021, 1:00 PM  [facility] [address] [place], [place], [address] Phone: [phone]   Fax:  [phone]  Name: Madison Hart MRN: [id number] Date of Birth: 04-Nov-1961

## 2021-08-11 ENCOUNTER — Encounter: Payer: Self-pay | Admitting: Physical Therapy

## 2021-08-11 ENCOUNTER — Other Ambulatory Visit: Payer: Self-pay

## 2021-08-11 ENCOUNTER — Ambulatory Visit: Payer: Managed Care, Other (non HMO) | Admitting: Physical Therapy

## 2021-08-11 DIAGNOSIS — R279 Unspecified lack of coordination: Secondary | ICD-10-CM

## 2021-08-11 DIAGNOSIS — M6281 Muscle weakness (generalized): Secondary | ICD-10-CM | POA: Diagnosis not present

## 2021-08-11 NOTE — Therapy (Signed)
[facility] [address], [place], [place], [address] Phone: [phone]   Fax:  [phone]  Physical Therapy Treatment  Patient Details  Name: Madison Hart MRN: [id number] Date of Birth: [birth year] Referring Provider (PT): Nunzio Cobbs, MD   Encounter Date: 08/11/2021   PT End of Session - 08/11/21 1158     Visit Number 3    Date for PT Re-Evaluation 10/17/21    PT Start Time 1156    PT Stop Time 1225    PT Time Calculation (min) 29 min    Activity Tolerance Patient tolerated treatment well    Behavior During Therapy [facility] for tasks assessed/performed             Past Medical History:  Diagnosis Date   Allergy    Anxiety    Depression    Elevated ALT measurement 2018   34   Fibromyalgia    Frequent headaches    Hyperlipidemia    Low vitamin D level    Migraine    Urinary tract infection    Urine incontinence     Past Surgical History:  Procedure Laterality Date   LIPOMA EXCISION  2006   left leg   TONSILLECTOMY     WISDOM TOOTH EXTRACTION      There were no vitals filed for this visit.   Subjective Assessment - 08/11/21 1402     Subjective I was sore after the massage last time.  I am doing the exercise and it feels like it is helping but still need more strength    Pertinent History fibromyalgia    Currently in Pain? No/denies                               [facility] Adult PT Treatment/Exercise - 08/11/21 0001       Lumbar Exercises: Standing   Wall Slides 10 reps   quarter with kegel   Other Standing Lumbar Exercises pallof press and rotation 10x    Other Standing Lumbar Exercises kneel and half kneel with lift - 10x                          PT Long Term Goals - 08/11/21 1401       PT LONG TERM GOAL #1   Title ind with advanced HEP    Status On-going                   Plan - 08/11/21 1358     Clinical Impression  Statement Pt arrived late.  Today's session focused on exercise progression for core and pelvic floor strength.  Education and cues to exhale on exertion.  Pt did well with exercises and has plan for doing them during her daily gardening.  Pt will benefit from skilled PT for 1-2 more visits to ensure successful strengthening program and decreased leakage.    PT Treatment/Interventions ADLs/Self Care Home Management;Biofeedback;Cryotherapy;Electrical Stimulation;Moist Heat;Therapeutic activities;Therapeutic exercise;Neuromuscular re-education;Patient/family education;Manual techniques;Passive range of motion;Dry needling;Taping    PT Next Visit Plan f/u on strength and progress band series on hip    Consulted and Agree with Plan of Care Patient             Patient will benefit from skilled therapeutic intervention in order to improve the following deficits and impairments:  Pain, Postural dysfunction, Increased fascial restricitons, Decreased strength, Decreased coordination,  Decreased range of motion, Decreased endurance, Increased muscle spasms  Visit Diagnosis: Muscle weakness (generalized)  Unspecified lack of coordination     Problem List Patient Active Problem List   Diagnosis Date Noted   Hyperlipidemia 02/25/2020   Low vitamin D level 02/25/2020   Abnormal LFTs 02/25/2020   Urinary incontinence 09/22/2014   GAD (generalized anxiety disorder) 09/22/2014   Panic disorder 09/22/2014   Fibromyalgia 09/22/2014    Jule Ser, PT 08/11/2021, 2:02 PM  [facility] [address], [place], [place], [address] Phone: [phone]   Fax:  [phone]  Name: Chyann Ambrocio MRN: [id number] Date of Birth: 1961-04-30

## 2021-09-08 ENCOUNTER — Encounter: Payer: Managed Care, Other (non HMO) | Admitting: Physical Therapy

## 2021-10-12 ENCOUNTER — Encounter: Payer: Managed Care, Other (non HMO) | Admitting: Physical Therapy

## 2022-03-01 ENCOUNTER — Encounter (HOSPITAL_COMMUNITY): Payer: Self-pay | Admitting: Emergency Medicine

## 2022-03-01 ENCOUNTER — Telehealth: Payer: Self-pay

## 2022-03-01 ENCOUNTER — Emergency Department (HOSPITAL_COMMUNITY)
Admission: EM | Admit: 2022-03-01 | Discharge: 2022-03-01 | Disposition: A | Discharge status: Home / Self Care | Payer: Managed Care, Other (non HMO) | Attending: Emergency Medicine | Admitting: Emergency Medicine

## 2022-03-01 ENCOUNTER — Emergency Department (HOSPITAL_COMMUNITY): Payer: Managed Care, Other (non HMO)

## 2022-03-01 ENCOUNTER — Other Ambulatory Visit: Payer: Self-pay

## 2022-03-01 DIAGNOSIS — R0789 Other chest pain: Secondary | ICD-10-CM | POA: Diagnosis not present

## 2022-03-01 DIAGNOSIS — R072 Precordial pain: Secondary | ICD-10-CM | POA: Diagnosis present

## 2022-03-01 LAB — CBC WITH DIFFERENTIAL/PLATELET
Abs Immature Granulocytes: 0.03 10*3/uL (ref 0.00–0.07)
Basophils Absolute: 0.1 10*3/uL (ref 0.0–0.1)
Basophils Relative: 1 %
Eosinophils Absolute: 0.1 10*3/uL (ref 0.0–0.5)
Eosinophils Relative: 1 %
HCT: 42.7 % (ref 36.0–46.0)
Hemoglobin: 14.1 g/dL (ref 12.0–15.0)
Immature Granulocytes: 1 %
Lymphocytes Relative: 16 %
Lymphs Abs: 1 10*3/uL (ref 0.7–4.0)
MCH: 31.1 pg (ref 26.0–34.0)
MCHC: 33 g/dL (ref 30.0–36.0)
MCV: 94.1 fL (ref 80.0–100.0)
Monocytes Absolute: 0.4 10*3/uL (ref 0.1–1.0)
Monocytes Relative: 6 %
Neutro Abs: 4.7 10*3/uL (ref 1.7–7.7)
Neutrophils Relative %: 75 %
Platelets: 235 10*3/uL (ref 150–400)
RBC: 4.54 MIL/uL (ref 3.87–5.11)
RDW: 12.4 % (ref 11.5–15.5)
WBC: 6.2 10*3/uL (ref 4.0–10.5)
nRBC: 0 % (ref 0.0–0.2)

## 2022-03-01 LAB — BASIC METABOLIC PANEL
Anion gap: 8 (ref 5–15)
BUN: 19 mg/dL (ref 8–23)
CO2: 25 mmol/L (ref 22–32)
Calcium: 9.2 mg/dL (ref 8.9–10.3)
Chloride: 110 mmol/L (ref 98–111)
Creatinine, Ser: 0.7 mg/dL (ref 0.44–1.00)
GFR, Estimated: >60 mL/min (ref >60)
Glucose, Bld: 111 mg/dL — ABNORMAL HIGH (ref 70–99)
Potassium: 3.6 mmol/L (ref 3.5–5.1)
Sodium: 143 mmol/L (ref 135–145)

## 2022-03-01 LAB — TROPONIN I (HIGH SENSITIVITY)
Troponin I (High Sensitivity): 3 ng/L (ref <18)
Troponin I (High Sensitivity): 4 ng/L (ref <18)

## 2022-03-01 MED ORDER — ACETAMINOPHEN 325 MG PO TABS
650.0000 mg | ORAL_TABLET | Freq: Once | ORAL | Status: AC
Start: 2022-03-01 — End: 2022-03-01
  Administered 2022-03-01: 650 mg via ORAL
  Filled 2022-03-01: qty 2

## 2022-03-01 NOTE — ED Provider Notes (Signed)
Repeat troponin is negative.  Patient here with atypical chest pain.  We will have her follow-up with cardiology.  Work-up is unremarkable.  Discharged in good condition. ? ?This chart was dictated using voice recognition software.  Despite best efforts to proofread,  errors can occur which can change the documentation meaning.  ?  Lennice Sites, DO ?03/01/22 1546 ? ?

## 2022-03-01 NOTE — ED Provider Notes (Signed)
?[facility] ?Provider Note ? ? ?CSN: [id number] ?Arrival date & time: 03/01/22  0944 ? ?  ? ?History ? ?Chief Complaint  ?Patient presents with  ? Chest Pain  ? ? ?Madison Hart is a 61 y.o. female. ? ? ?Chest Pain ?Associated symptoms: back pain   ?Patient presents for chest pain.  Yesterday morning, she woke up with what she describes as sharp chest pain to the left border of her sternum.  Pain radiated to the left side of her back.  She denies any associated shortness of breath, nausea, or diaphoresis.  Throughout the day, it did diminish.  She reports similar episodes in the past that resolved without any intervention or medical evaluation.  Today, she continued to feel slight discomfort in these areas.  For this reason, she presents to the ED.  Currently pain is minimal.  It is slightly worsened with deep inspiration, movement, and palpation.  Her medical history is notable for fibromyalgia, depression, anxiety, and HLD.  She does not smoke.  She does not have any family history of early heart attacks. ?HPI: A 61 year old patient with a history of hypercholesterolemia presents for evaluation of chest pain. Initial onset of pain was less than one hour ago. The patient's chest pain is well-localized, is sharp and is not worse with exertion. The patient's chest pain is middle- or left-sided, is not described as heaviness/pressure/tightness and does not radiate to the arms/jaw/neck. The patient does not complain of nausea and denies diaphoresis. The patient has no history of stroke, has no history of peripheral artery disease, has not smoked in the past 90 days, denies any history of treated diabetes, has no relevant family history of coronary artery disease (first degree relative at less than age 76), is not hypertensive and does not have an elevated BMI (>=30).  ? ?Home Medications ?Prior to Admission medications   ?Medication Sig Start Date End Date Taking? Authorizing  Provider  ?Acetaminophen (TYLENOL PO) Take by mouth as needed.    [provider]  ?Probiotic Product (PROBIOTIC PO) Take by mouth.    [provider]  ?   ? ?Allergies    ?Claritin [loratadine], Detrol [tolterodine], Erythromycin, and Penicillins   ? ?Review of Systems   ?Review of Systems  ?Cardiovascular:  Positive for chest pain.  ?Musculoskeletal:  Positive for back pain.  ?All other systems reviewed and are negative. ? ?Physical Exam ?Updated Vital Signs ?BP (!) 152/84   Pulse 67   Temp 98.7 ?F (37.1 ?C) (Oral)   Resp 15   Ht '5\' 4"'$  (1.626 m)   Wt 63.5 kg   LMP 11/13/2012 (Approximate)   SpO2 99%   BMI 24.03 kg/m?  ?Physical Exam ?Vitals and nursing note reviewed.  ?Constitutional:   ?   General: She is not in acute distress. ?   Appearance: She is well-developed and normal weight. She is not ill-appearing, toxic-appearing or diaphoretic.  ?HENT:  ?   Head: Normocephalic and atraumatic.  ?Eyes:  ?   Extraocular Movements: Extraocular movements intact.  ?   Conjunctiva/sclera: Conjunctivae normal.  ?Neck:  ?   Vascular: No JVD.  ?Cardiovascular:  ?   Rate and Rhythm: Normal rate and regular rhythm.  ?   Pulses:     ?     Radial pulses are 2+ on the right side and 2+ on the left side.  ?   Heart sounds: Normal heart sounds. No murmur heard. ?Pulmonary:  ?   Effort:  Pulmonary effort is normal. No respiratory distress.  ?   Breath sounds: Normal breath sounds. No decreased breath sounds, wheezing, rhonchi or rales.  ?Chest:  ?   Chest wall: Tenderness present.  ?Abdominal:  ?   Palpations: Abdomen is soft.  ?   Tenderness: There is no abdominal tenderness.  ?Musculoskeletal:     ?   General: No swelling.  ?   Cervical back: Normal range of motion and neck supple.  ?   Right lower leg: No edema.  ?   Left lower leg: No edema.  ?Skin: ?   General: Skin is warm and dry.  ?   Capillary Refill: Capillary refill takes less than 2 seconds.  ?Neurological:  ?   General: No focal deficit present.   ?   Mental Status: She is alert and oriented to person, place, and time.  ?   Cranial Nerves: No cranial nerve deficit.  ?   Motor: No weakness.  ?Psychiatric:     ?   Mood and Affect: Mood normal.     ?   Behavior: Behavior normal.  ? ? ?ED Results / Procedures / Treatments   ?Labs ?(all labs ordered are listed, but only abnormal results are displayed) ?Labs Reviewed  ?BASIC METABOLIC PANEL - Abnormal; Notable for the following components:  ?    Result Value  ? Glucose, Bld 111 (*)   ? All other components within normal limits  ?CBC WITH DIFFERENTIAL/PLATELET  ?TROPONIN I (HIGH SENSITIVITY)  ?TROPONIN I (HIGH SENSITIVITY)  ? ? ?EKG ?EKG Interpretation ? ?Date/Time:  Wednesday March 01 2022 09:45:44 EDT ?Ventricular Rate:  71 ?PR Interval:  136 ?QRS Duration: 96 ?QT Interval:  406 ?QTC Calculation: 441 ?R Axis:   72 ?Text Interpretation: Normal sinus rhythm Nonspecific ST abnormality Abnormal ECG No previous ECGs available Confirmed by Godfrey Pick [phone]) on 03/01/2022 2:00:04 PM ? ?Radiology ?DG Chest 2 View ? ?Result Date: 03/01/2022 ?CLINICAL DATA:  Chest pain.  Sharp left-sided chest pain. EXAM: CHEST - 2 VIEW COMPARISON:  None. FINDINGS: Cardiac silhouette and mediastinal contours are within normal limits. The lungs are clear. No pleural effusion or pneumothorax. Minimal dextrocurvature of the midthoracic spine and mild multilevel degenerative disc changes. IMPRESSION: No active cardiopulmonary disease. Electronically Signed   By: Yvonne Kendall M.D.   On: 03/01/2022 10:54   ? ?Procedures ?Procedures  ? ? ?Medications Ordered in ED ?Medications  ?acetaminophen (TYLENOL) tablet 650 mg (650 mg Oral Given 03/01/22 1508)  ? ? ?ED Course/ Medical Decision Making/ A&P ?  ?HEAR Score: 3                       ?Medical Decision Making ?Risk ?OTC drugs. ? ? ?This patient presents to the ED for concern of chest pain, this involves an extensive number of treatment options, and is a complaint that carries with it a high risk of  complications and morbidity.  The differential diagnosis includes ACS, dissection, PE, pericarditis, chest wall inflammation, GERD ? ? ?Co morbidities that complicate the patient evaluation ? ?fibromyalgia, depression, anxiety, and HLD ? ? ?Additional history obtained: ? ?Additional history obtained from N/A ?External records from outside source obtained and reviewed including EMR ? ? ?Lab Tests: ? ?I Ordered, and personally interpreted labs.  The pertinent results include: Normal findings, including normal troponin ? ? ?Imaging Studies ordered: ? ?I ordered imaging studies including chest x-ray ?I independently visualized and interpreted imaging which showed no acute findings ?I  agree with the radiologist interpretation ? ? ?Problem List / ED Course / Critical interventions / Medication management ? ?Patient is a healthy 61 year old female who presents for chest discomfort for the past 2 days.  It is not worsened with exertion.  It is located left of sternum.  It does radiate to the left side of her back.  It is worsened with palpation and deep inspiration.  On exam, patient is well-appearing.  Her breathing is unlabored.  Her lungs are clear to auscultation.  She currently describes her discomfort as very minimal.  She initially declines any analgesia.  Prior to being bedded in the ED, patient was able to undergo diagnostic work-up.  EKG shows T wave abnormality in lead III.  Prior EKGs are available for comparison.  Aced on her symptoms, risk factors, and EKG, patient's heart score is a 3.  Initial troponin was normal.  Patient is PERC negative.  Given her chest pain that radiates to the back, I did consider CTA dissection study.  Given the patient's well appearance and minimal symptoms, I have low suspicion of dissection.  Imaging study was offered to the patient but patient had reservations feeling that she may have a reaction from the contrast dye.  Although she does not have a history of reaction to contrast,  she states that she often gets reactions from various chemicals.  Given the low suspicion for dissection and/or PE, as well as the patient's concern of allergic reaction, no contrasted imaging studies were ordere

## 2022-03-01 NOTE — ED Notes (Signed)
Reviewed discharge instructions with patient and daughter. Follow-up care reviewed. Patient and daughter verbalized understanding. Patient A&Ox4, VSS, and ambulatory with steady gait upon discharge.  

## 2022-03-01 NOTE — Telephone Encounter (Signed)
[facility] - Client ?TELEPHONE ADVICE RECORD ?AccessNurse? ?Patient ?Name: ?Madison DAV ?IDSON ?Gender: Female ?DOB: 1961-10-16 ?Age: 61 Y 55 M 30 D ?Return ?Phone ?Number: ?[phone] ?(Primary) ?Address: ?City/ ?State/ ?Zip: [place] ? [address] ?Client [facility] - Client ?Client [facility] ?Provider Waunita Schooner- MD ?Contact Type Call ?Who Is Calling Patient / Member / Family / Caregiver ?Call Type Triage / Clinical ?Relationship To Patient Self ?Return Phone Number [phone] (Primary) ?Chief Complaint CHEST PAIN - pain, pressure, heaviness or ?tightness ?Reason for Call Symptomatic / Request for Health Information ?Initial Comment Caller states that she is having chest and back pain. ?Translation No ?Nurse Assessment ?Nurse: Raenette Rover, RN, Zella Ball Date/Time (Eastern Time): 03/01/2022 8:40:17 AM ?Confirm and document reason for call. If ?symptomatic, describe symptoms. ?---Caller states that she is having chest and back pain. ?Described as on the left side of the chest and left arm ?pain. Hx of fibromylgia this is the 3-4th time when she ?wakes up with an intense pain and goes up her neck. ?Does the patient have any new or worsening ?symptoms? ---Yes ?Will a triage be completed? ---Yes ?Related visit to physician within the last 2 weeks? ---No ?Does the PT have any chronic conditions? (i.e. ?diabetes, asthma, this includes High risk factors for ?pregnancy, etc.) ?---Yes ?List chronic conditions. ---fibromylgia. ?Is this a behavioral health or substance abuse call? ---No ?Guidelines ?Guideline Title Affirmed Question Affirmed Notes Nurse Date/Time (Eastern ?Time) ?Chest Pain [1] Chest pain lasts > ?5 minutes AND [2] ?age > 36 ?Raenette Rover, RN, Zella Ball 03/01/2022 8:41:46 ?AM ?Disp. Time (Eastern ?Time) Disposition Final User ?03/01/2022 8:36:52 AM Send to Urgent Queue Soyla Murphy ?03/01/2022 8:51:17 AM 911 Outcome Documentation  Raenette Rover, RN, Zella Ball ?Reason: 911 is on the way. ?PLEASE NOTE: All timestamps contained within this report are represented as [place] Standard Time. ?CONFIDENTIALTY NOTICE: This fax transmission is intended only for the addressee. It contains information that is legally privileged, confidential or ?otherwise protected from use or disclosure. If you are not the intended recipient, you are strictly prohibited from reviewing, disclosing, copying using ?or disseminating any of this information or taking any action in reliance on or regarding this information. If you have received this fax in error, please ?notify us immediately by telephone so that we can arrange for its return to [place]. Phone: [phone], Toll-Free: [phone], Fax: [phone] ?Page: 2 of 2 ?Call Id: [id number] ?03/01/2022 8:42:37 AM Call EMS 911 Now Yes Raenette Rover, RN, Zella Ball ?Caller Disagree/Comply Comply ?Caller Understands Yes ?PreDisposition InappropriateToAsk ?Care Advice Given Per Guideline ?CALL EMS 911 NOW: CARE ADVICE given per Chest Pain (Adult) guideline. * Available: As either 81 mg (taking two = 162 ?mg), 162 mg, or 325 mg tablets. ?Comments ?User: Wilson Singer, RN Date/Time Eilene Ghazi Time): 03/01/2022 8:50:39 AM ?Caller described this pain as an intense pain that wakes her up and is in the left side of her chest that goes up ?her left arm and into the left side of the jaw. Caller is wanting to call family members first before calling 911. ?Explained time is muscle when it comes to the heart and not to make all her calls first but to call 911 first then ?she can notify everyone else after that. Advised on the importence of early treatment and early EMS intervention. ?Advised to take ASa on hand and best to chew it. ?Referrals ?[facility]

## 2022-03-01 NOTE — ED Triage Notes (Signed)
Per [facility] pt coming from home- c/o sharp left sided chest pain x 2 days. Hx of fibromyalgia with daily pain. Denies any N/V or shortness of breath. Pain worse with movement.  20G LAC. 325 aspirin PTA.  ?

## 2022-03-01 NOTE — Telephone Encounter (Signed)
According to pt's chart, she arrived at ED at 9:44am. Will check in later today. ?

## 2022-03-01 NOTE — ED Provider Triage Note (Signed)
Emergency Medicine Provider Triage Evaluation Note ? ?Madison Hart , a 61 y.o. female  was evaluated in triage.  Pt complains of chest pain.  Patient states that she is having sharp, central chest pain that radiates to her back since yesterday.  She has had 2 other episodes this past month of similar pain that abated on their own.  She denies any radiation to the jaw or arm.  Denies nausea or diaphoresis.  She denies any recent cough or fevers.  Denies palpitations, lower extremity swelling, shortness of breath.Marland Kitchen ?Only PERC criteria is age ?Review of Systems  ?Positive: See above ?Negative:  ? ?Physical Exam  ?BP (!) 159/91 (BP Location: Right Arm)   Pulse 72   Temp 98.7 ?F (37.1 ?C) (Oral)   Resp 16   LMP 11/13/2012 (Approximate)   SpO2 94%  ?Gen:   Awake, no distress   ?Resp:  Normal effort, lungs clear ?MSK:   Moves extremities without difficulty  ?Other:  S1/S2 without murmur ? ?Medical Decision Making  ?Medically screening exam initiated at 10:08 AM.  Appropriate orders placed.  Madison Hart was informed that the remainder of the evaluation will be completed by another provider, this initial triage assessment does not replace that evaluation, and the importance of remaining in the ED until their evaluation is complete. ? ? ?  ?Mickie Hillier, PA-C ?03/01/22 1009 ? ?

## 2022-03-01 NOTE — Telephone Encounter (Signed)
Unable to reach pt or pts husband (DPR signed) by phone; per access nurse note pt agreed to comply to go to ED. Sending note to Dr Einar Pheasant and [facility] CMA. ?

## 2022-03-01 NOTE — Telephone Encounter (Signed)
Agree with plan for ER. Routing to MA to check back with patient later today/tomorrow ?

## 2022-03-20 ENCOUNTER — Ambulatory Visit (INDEPENDENT_AMBULATORY_CARE_PROVIDER_SITE_OTHER): Payer: Managed Care, Other (non HMO) | Admitting: Internal Medicine

## 2022-03-20 ENCOUNTER — Encounter: Payer: Self-pay | Admitting: Internal Medicine

## 2022-03-20 VITALS — BP 158/79 | HR 80 | Ht 64.0 in | Wt 142.0 lb

## 2022-03-20 DIAGNOSIS — R0789 Other chest pain: Secondary | ICD-10-CM

## 2022-03-20 DIAGNOSIS — E782 Mixed hyperlipidemia: Secondary | ICD-10-CM | POA: Diagnosis not present

## 2022-03-20 NOTE — Patient Instructions (Signed)
Medication Instructions:  NO CHANGES  *If you need a refill on your cardiac medications before your next appointment, please call your pharmacy*   Follow Up: At [facility], you and your health needs are our priority.  As part of our continuing mission to provide you with exceptional heart care, we have created designated Provider Care Teams.  These Care Teams include your primary Cardiologist (physician) and Advanced Practice Providers (APPs -  Physician Assistants and Nurse Practitioners) who all work together to provide you with the care you need, when you need it.  We recommend signing up for the patient portal called "MyChart".  Sign up information is provided on this After Visit Summary.  MyChart is used to connect with patients for Virtual Visits (Telemedicine).  Patients are able to view lab/test results, encounter notes, upcoming appointments, etc.  Non-urgent messages can be sent to your provider as well.   To learn more about what you can do with MyChart, go to [email or web address].    Your next appointment:    AS NEEDED with Dr. Hilty  

## 2022-03-20 NOTE — Progress Notes (Signed)
? ?OFFICE CONSULT NOTE ? ?Chief Complaint:  ?Follow-up chest pain ? ?Primary Care Physician: ?Lesleigh Noe, MD ? ?HPI:  ?Madison Hart is a 61 y.o. female who is being seen today for the evaluation of chest pain at the request of Lesleigh Noe, MD. This is a pleasant 61 yo female with little past medical history, but primarily fibromyalgia which she manages with diet and lifestyle.  She also has a history of anxiety, depression, frequent headaches for which she takes aspirin a low vitamin D level.  She presented recently to the emergency department after having several episodes of bilateral chest pain that radiated to the back.  Mostly was noted in the left side and could radiate down her arms.  She was initially diagnosed with fibromyalgia in 1984 and has had a number of different symptoms associated with that.  There is heart disease in the family occluding her father who had an angioplasty but ultimately died in his 8s of cancer.  In the past she has had elevated cholesterol with an LDL of 151 in 2021 but is not on therapy.  Both parents had hypertension but she does not take medication.  Blood pressure was elevated today 158/79.  She has checked it at home and runs in the 657-846 systolic.  EKG personally reviewed from the hospital shows normal sinus rhythm with nonspecific T wave changes at 71.  The episodes of chest pain were primarily at rest and not associated with exertion.  She says it was worse somewhat laying on her left side.  The chest pain has not recurred in the past 3 weeks ? ?PMHx:  ?Past Medical History:  ?Diagnosis Date  ? Allergy   ? Anxiety   ? Depression   ? Elevated ALT measurement 2018  ? 34  ? Fibromyalgia   ? Frequent headaches   ? Hyperlipidemia   ? Low vitamin D level   ? Migraine   ? Urinary tract infection   ? Urine incontinence   ? ? ?Past Surgical History:  ?Procedure Laterality Date  ? LIPOMA EXCISION  2006  ? left leg  ? TONSILLECTOMY    ? WISDOM TOOTH EXTRACTION     ? ? ?FAMHx:  ?Family History  ?Problem Relation Age of Onset  ? Cancer Father   ?     mesothelioma - worked in Automotive engineer  ? Hyperlipidemia Father   ? Hypertension Father   ? Celiac disease Sister   ? Migraines Sister   ? Hyperlipidemia Mother   ? Alzheimer's disease Mother   ? Anxiety disorder Son   ? Stroke Maternal Grandmother 41  ? Alzheimer's disease Paternal Grandmother   ? ? ?SOCHx:  ? reports that she has never smoked. She has never used smokeless tobacco. She reports that she does not drink alcohol and does not use drugs. ? ?ALLERGIES:  ?Allergies  ?Allergen Reactions  ? Claritin [Loratadine] Other (See Comments)  ?  aggravates  Fibromyalgia  ? Detrol [Tolterodine] Other (See Comments)  ?  "made patient paranoid"  ? Erythromycin Other (See Comments)  ?  GI upset  ? Penicillins Hives  ? ? ?ROS: ?Pertinent items noted in HPI and remainder of comprehensive ROS otherwise negative. ? ?HOME MEDS: ?Current Outpatient Medications on File Prior to Visit  ?Medication Sig Dispense Refill  ? Acetaminophen (TYLENOL PO) Take by mouth as needed.    ? aspirin 325 MG tablet Take 325 mg by mouth as needed.    ? Probiotic Product (PROBIOTIC  PO) Take by mouth.    ? ?No current facility-administered medications on file prior to visit.  ? ? ?LABS/IMAGING: ?No results found for this or any previous visit (from the past 48 hour(s)). ?No results found. ? ?LIPID PANEL: ?   ?Component Value Date/Time  ? CHOL 218 (H) 02/25/2020 1103  ? CHOL 212 (H) 04/20/2017 0846  ? TRIG 119.0 02/25/2020 1103  ? HDL 43.60 02/25/2020 1103  ? HDL 41 04/20/2017 0846  ? CHOLHDL 5 02/25/2020 1103  ? VLDL 23.8 02/25/2020 1103  ? LDLCALC 151 (H) 02/25/2020 1103  ? [place] 142 (H) 04/20/2017 0846  ? ? ?WEIGHTS: ?Wt Readings from Last 3 Encounters:  ?03/20/22 142 lb (64.4 kg)  ?03/01/22 140 lb (63.5 kg)  ?06/28/21 140 lb (63.5 kg)  ? ? ?VITALS: ?BP (!) 158/79   Pulse 80   Ht '5\' 4"'$  (1.626 m)   Wt 142 lb (64.4 kg)   LMP 11/13/2012 (Approximate)   SpO2  98%   BMI 24.37 kg/m?  ? ?EXAM: ?General appearance: alert and no distress ?Neck: no carotid bruit, no JVD, and thyroid not enlarged, symmetric, no tenderness/mass/nodules ?Lungs: clear to auscultation bilaterally ?Heart: regular rate and rhythm ?Abdomen: soft, non-tender; bowel sounds normal; no masses,  no organomegaly ?Extremities: extremities normal, atraumatic, no cyanosis or edema ?Pulses: 2+ and symmetric ?Skin: Skin color, texture, turgor normal. No rashes or lesions ?Neurologic: Grossly normal ?Psych: Pleasant ? ?*Examination chaperoned by Sheral Apley, RN. ? ?EKG: ?ER EKG reviewed -normal sinus rhythm with nonspecific T wave changes 71 - personally reviewed ? ?ASSESSMENT: ?Noncardiac chest pain ?Family history of coronary disease ?History of dyslipidemia ? ?PLAN: ?1.   Madison Hart has likely noncardiac chest pain.  ER work-up was negative.  There is family history of dyslipidemia and hypertension and she has had both in the past but is not on therapies.  She does see a primary care provider.  I encouraged her to follow-up there.  I also offered a calcium score which would be encouraging if it was 0 or low risk and also could be helpful in restratification.  She would consider that and I told her to contact us if she decides to go forward with it.  I am happy to see her back on an as-needed basis. ? ?Pixie Casino, MD, [facility], FACP  ?[place]  ?Medical Director of the [facility] Lip[facility] &  ?Card[facility] ?Diplomate of the [employer] of Clinical Lipidology ?Attending Cardiologist  ?Direct Dial: [phone]  Fax: [phone]  ?Website:  [email or web address] ? ?Madison Hart ?03/20/2022, 3:33 PM ?

## 2022-10-03 IMAGING — CR DG CHEST 2V
2 series · 2 of 2 positions shown · non-contrast
Comparison: None.

CLINICAL DATA: Chest pain.  Sharp left-sided chest pain.

EXAM:
CHEST - 2 VIEW

[chest pa]
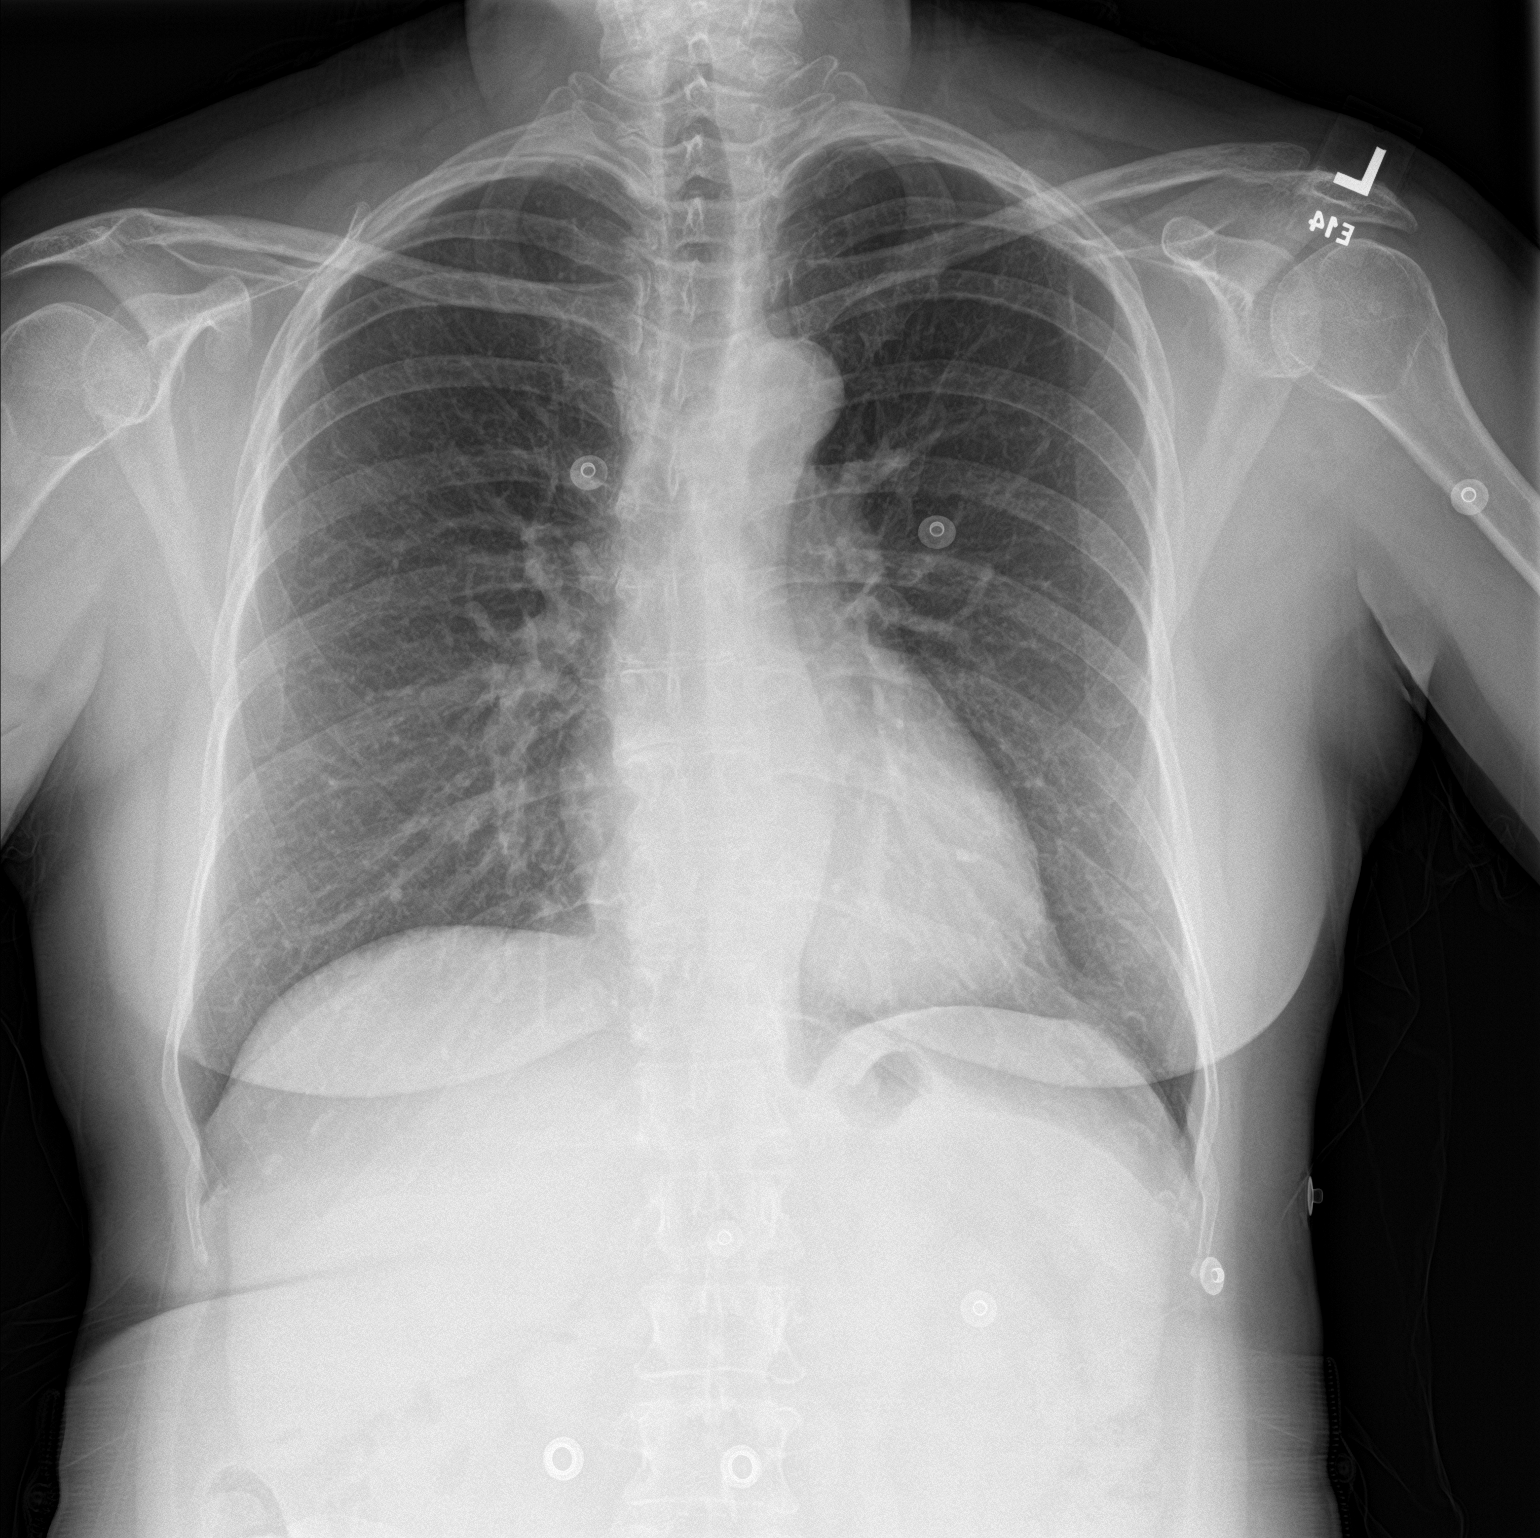

[chest lat]
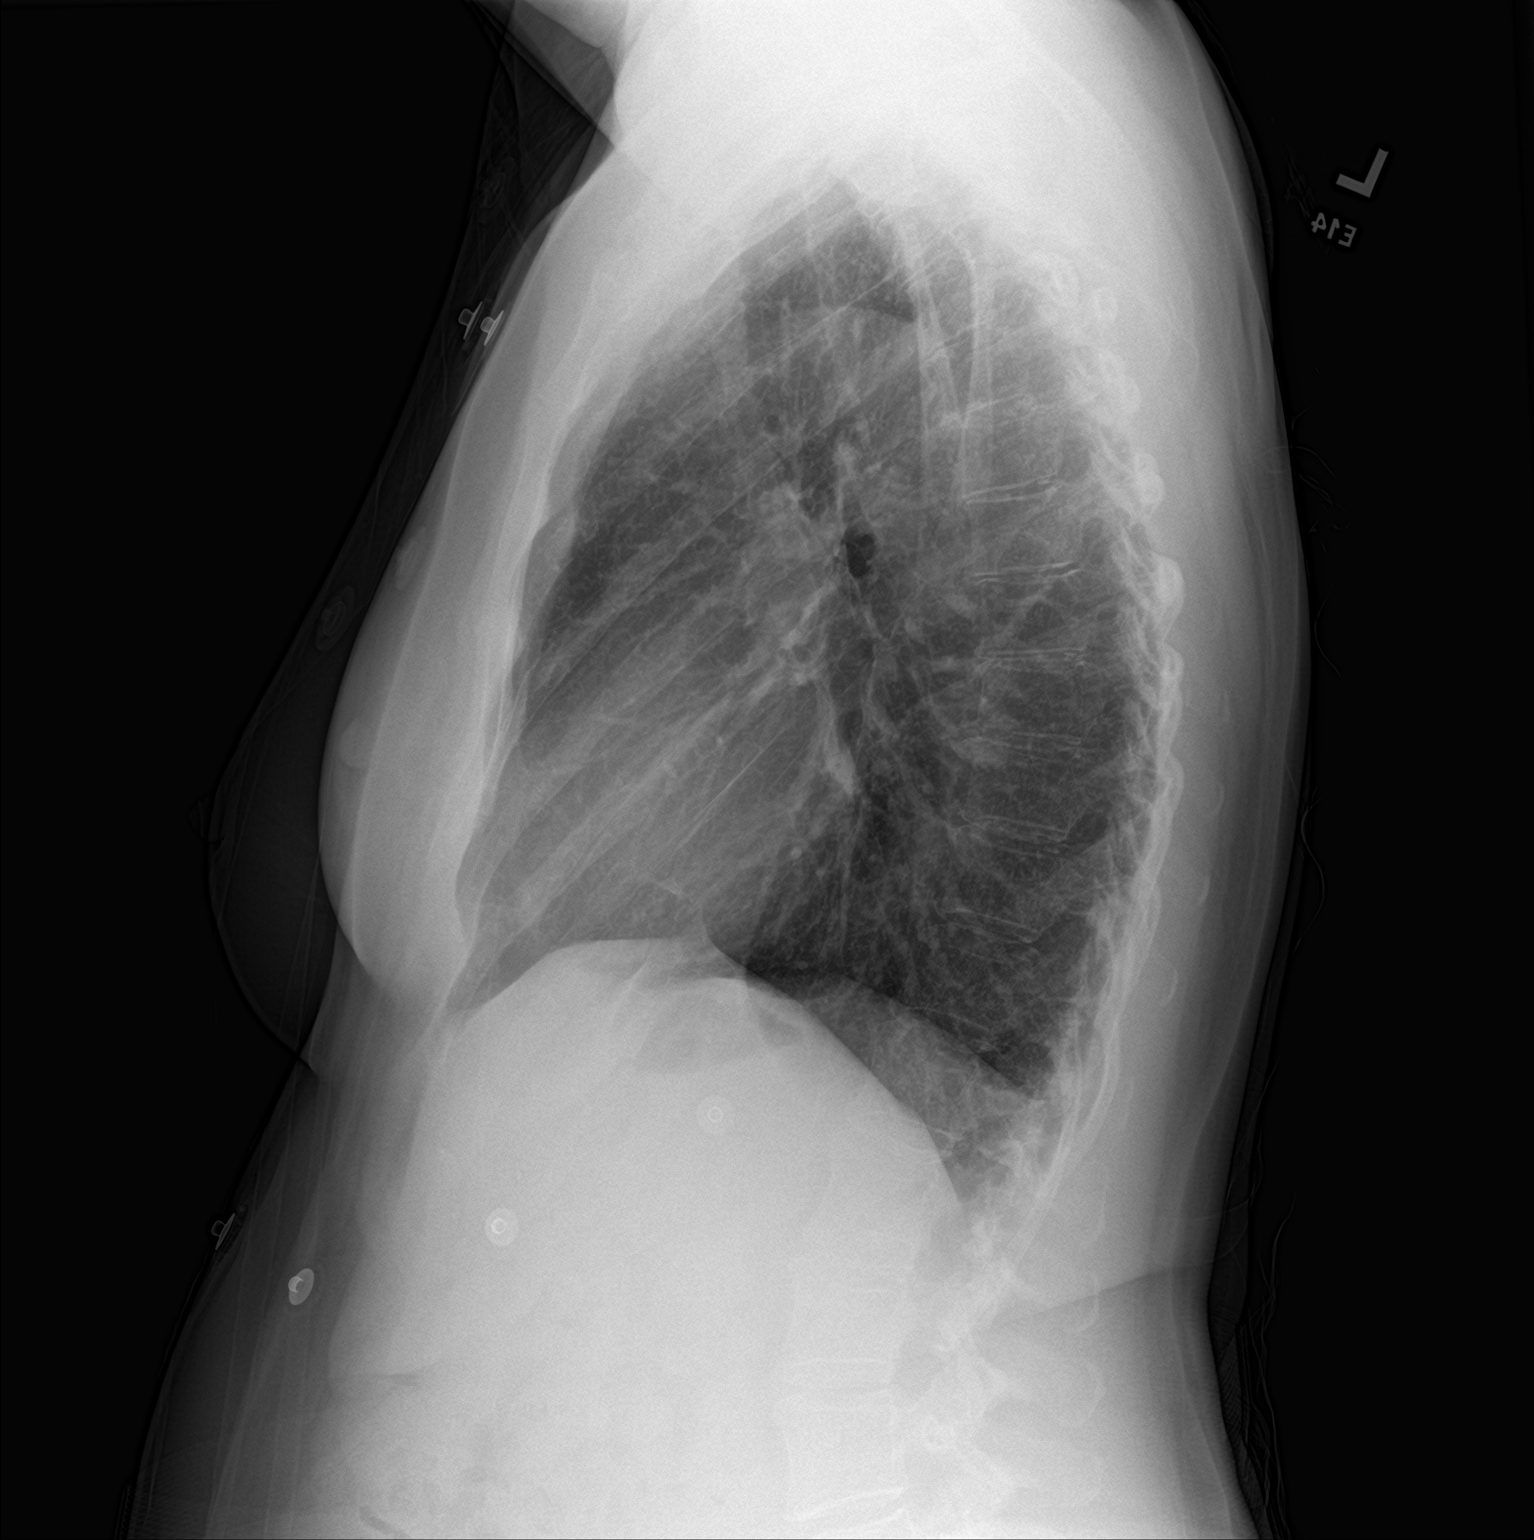

[2 of 2 positions shown; findings below may reference images not displayed]

FINDINGS: Cardiac silhouette and mediastinal contours are within normal
limits. The lungs are clear. No pleural effusion or pneumothorax.
Minimal dextrocurvature of the midthoracic spine and mild multilevel
degenerative disc changes.
IMPRESSION: No active cardiopulmonary disease.

## 2024-01-24 ENCOUNTER — Telehealth: Payer: Self-pay | Admitting: Family Medicine

## 2024-01-24 NOTE — Telephone Encounter (Unsigned)
 Copied from CRM [id number]. Topic: Clinical - Medical Advice >> Jan 24, 2024  1:57 PM Gildardo Pounds wrote: Reason for CRM: Patient inquiring if Amber Howard's holistic approach to practicing medication mean that she will approach holistic treatments outside of prescribing medication. Callback number is [phone]

## 2024-05-08 ENCOUNTER — Ambulatory Visit: Admitting: Family

## 2024-06-03 ENCOUNTER — Ambulatory Visit: Admitting: Family Medicine
